# Patient Record
Sex: Male | Born: 1955 | Race: White | Hispanic: No | Marital: Married | Smoking: Never smoker
Health system: Southern US, Community
[De-identification: ages and names within clinical notes are randomized; demographics above are authoritative.]

## PROBLEM LIST (undated history)

## (undated) DIAGNOSIS — C4491 Basal cell carcinoma of skin, unspecified: Secondary | ICD-10-CM

## (undated) HISTORY — PX: FRACTURE SURGERY: SHX138

## (undated) HISTORY — PX: HERNIA REPAIR: SHX51

## (undated) HISTORY — DX: Basal cell carcinoma of skin, unspecified: C44.91

---

## 2010-12-04 ENCOUNTER — Encounter (HOSPITAL_COMMUNITY): Payer: BC Managed Care – PPO | Attending: General Surgery

## 2010-12-04 DIAGNOSIS — K4020 Bilateral inguinal hernia, without obstruction or gangrene, not specified as recurrent: Secondary | ICD-10-CM | POA: Insufficient documentation

## 2010-12-04 DIAGNOSIS — Z01818 Encounter for other preprocedural examination: Secondary | ICD-10-CM | POA: Insufficient documentation

## 2010-12-04 LAB — COMPREHENSIVE METABOLIC PANEL
ALT: 22 U/L (ref 0–53)
AST: 28 U/L (ref 0–37)
Albumin: 3.8 g/dL (ref 3.5–5.2)
CO2: 28 mEq/L (ref 19–32)
Chloride: 104 mEq/L (ref 96–112)
GFR calc Af Amer: >60 mL/min (ref >60)
GFR calc non Af Amer: >60 mL/min (ref >60)
Sodium: 139 mEq/L (ref 135–145)
Total Bilirubin: 0.8 mg/dL (ref 0.3–1.2)

## 2010-12-04 LAB — DIFFERENTIAL
Basophils Absolute: 0 10*3/uL (ref 0.0–0.1)
Basophils Relative: 0 % (ref 0–1)
Neutro Abs: 2.6 10*3/uL (ref 1.7–7.7)
Neutrophils Relative %: 54 % (ref 43–77)

## 2010-12-04 LAB — CBC
Hemoglobin: 14 g/dL (ref 13.0–17.0)
Platelets: 205 10*3/uL (ref 150–400)
RBC: 4.45 MIL/uL (ref 4.22–5.81)
WBC: 4.9 10*3/uL (ref 4.0–10.5)

## 2010-12-04 LAB — SURGICAL PCR SCREEN: MRSA, PCR: NEGATIVE

## 2010-12-04 LAB — URINALYSIS, ROUTINE W REFLEX MICROSCOPIC
Bilirubin Urine: NEGATIVE
Hgb urine dipstick: NEGATIVE
Ketones, ur: NEGATIVE mg/dL
Nitrite: NEGATIVE
pH: 7 (ref 5.0–8.0)

## 2010-12-16 ENCOUNTER — Ambulatory Visit (HOSPITAL_COMMUNITY)
Admission: RE | Admit: 2010-12-16 | Discharge: 2010-12-16 | Disposition: A | Discharge status: Home / Self Care | Payer: BC Managed Care – PPO | Attending: General Surgery | Admitting: General Surgery

## 2010-12-16 DIAGNOSIS — K4020 Bilateral inguinal hernia, without obstruction or gangrene, not specified as recurrent: Secondary | ICD-10-CM | POA: Insufficient documentation

## 2010-12-20 NOTE — Op Note (Signed)
Jorge Finley, Jorge Finley                 ACCOUNT NO.:  [id number]  MEDICAL RECORD NO.:  [id number]           PATIENT TYPE:  O  LOCATION:  DAYL                         FACILITY:  [facility]  PHYSICIAN:  Angelia Mould. Derrell Lolling, M.D.DATE OF BIRTH:  [birth year]  DATE OF PROCEDURE:  12/16/2010 DATE OF DISCHARGE:                              OPERATIVE REPORT   PREOPERATIVE DIAGNOSIS:  Bilateral inguinal hernias.  POSTOPERATIVE DIAGNOSIS:  Bilateral inguinal hernias.  OPERATION PERFORMED:  Laparoscopic, preperitoneal repair of bilateral inguinal hernias with mesh.  SURGEON:  Angelia Mould. Derrell Lolling, M.D.  OPERATIVE INDICATIONS:  This is a thin, healthy 54 year old gentleman who enjoys good health.  He is very active physically including running triathlons.  For the past 3 months, he has got some pain in the right groin but he has continued to exercise.  Pain has become more persistent.  He saw Dr. Mila Homer at [facility] who was suspicious of a hernia as well as constipation.  I evaluated the patient in my office about 3 to 4 weeks ago and found that he actually had bilateral inguinal hernias and the left side was actually bit larger on exam, but these did not extend into the scrotum.  There was no inguinal adenopathy or mass.  He really was not tender.  He moved all 4 extremities well without pain or deformity.  He would like to go ahead and have these repaired at this time and is brought to operating room electively.  OPERATIVE TECHNIQUE:  Following the induction of general endotracheal anesthesia, a Foley catheter was inserted and the bladder was emptied. The Foley was left in place with the balloon deflated.  The abdomen and genitalia were prepped and draped in sterile fashion.  Intravenous antibiotics were given.  Surgical time-out was held identifying correct patient, correct procedure and correct site.  A 0.5% Marcaine with epinephrine was used as local infiltration anesthetic.  A  curved transverse incision was made at the lower rim of the umbilicus.  The fascia was incised transversely exposing the right rectus muscle.  A dissector balloon was placed in the right rectus sheath behind the rectus muscle down to the symphysis pubis.  A video camera was inserted. The dissector balloon was inflated under direct vision and held in place for about 5 minutes.  We had good visualization of symphysis pubis, Cooper's ligaments, inferior epigastric vessels and the rectus muscles anteriorly.  We then deflated the balloon.  We inflated the trocar, balloon and secured that and connected it to the insufflator at 14 mmHg. Video camera was inserted and we had good visualization.  The operating space was small because of the patient's small body habitus.  We put a 5- mm trocar in the midline below the umbilicus and used that to clean off the peritoneum from the fascia of the lateral abdominal wall on both the right side and the left side.  We ultimately put a 5-mm trocar in the right side and left side above the level of the anterior superior iliac spine.  On each side, we cleaned off the peritoneum and pulled it down away from  the lateral abdominal wall.  On each side, we found an indirect hernia sac which we pulled back as far as possible but certainly well above the level of the anterior superior iliac spine.  On the left side, he actually also had direct hernia and we debrided the fatty tissue off that allowing the pseudo-sac to retract.  After all this was done, we found that we had a pneumoperitoneum from a small hole on the right side and we simply put a Veress needle in the right upper quadrant to evacuate that.  We repaired both sides with 3 inch x 6 inch pieces of Ultrapro mesh. The pieces of mesh were overlapped slightly in the midline.  A 5-mm ProTack was used to secure the mesh along the superior rim of Cooper's ligament on each side, along the posterior belly of the  rectus muscle and up the midline.  Laterally on each side we placed 3 or 4 tacks but made sure that we could palpate the tacker through the abdominal wall to make sure that we were above the iliopubic tract.  On inspection of the right side, I felt that the mesh had pulled up a little bit above the superior edge of Cooper's ligament near the iliac vessels and so I took a 3 inch x 3 inch piece of Ultrapro mesh and placed it in the abdomen and also placed it over this area to make sure that we did not get direct hernia recurrence.  I placed a couple of ProTack superiorly well above the level the iliopubic tract to hold this in place.  We then made sure that we pulled all the peritoneum back and tucked the mesh down inferiorly.  The pneumoperitoneum was released under direct vision. Everything looked good.  There was no bleeding.  The trocars were all removed.  The pneumoperitoneum was released all the way and the Veress needle taken out of the right upper quadrant.  The fascia at the umbilicus was closed with 2 interrupted figure-of-eight sutures of 0 Vicryl.  After irrigating the wounds, they were all closed with subcuticular suture of 4-0 Monocryl and Dermabond.  The patient was taken to the recovery room in stable condition.  Estimated blood loss was about 10 to 20 cc.  Complications were none.  Sponge, needle and instrument counts were correct.     Angelia Mould. Derrell Lolling, M.D.     HMI/MEDQ  D:  12/16/2010  T:  12/16/2010  Job:  [id number]  cc:   Dr. Mila Homer  Electronically Signed by Claud Kelp M.D. on 12/20/2010 07:10:04 PM

## 2013-06-28 ENCOUNTER — Encounter: Payer: Self-pay | Admitting: Radiology

## 2016-06-17 ENCOUNTER — Other Ambulatory Visit: Payer: Self-pay | Admitting: Radiation Oncology

## 2018-02-08 DIAGNOSIS — H18413 Arcus senilis, bilateral: Secondary | ICD-10-CM | POA: Diagnosis not present

## 2018-02-08 DIAGNOSIS — H11423 Conjunctival edema, bilateral: Secondary | ICD-10-CM | POA: Diagnosis not present

## 2018-02-08 DIAGNOSIS — H11153 Pinguecula, bilateral: Secondary | ICD-10-CM | POA: Diagnosis not present

## 2018-02-08 DIAGNOSIS — H40013 Open angle with borderline findings, low risk, bilateral: Secondary | ICD-10-CM | POA: Diagnosis not present

## 2018-05-01 DIAGNOSIS — L570 Actinic keratosis: Secondary | ICD-10-CM | POA: Diagnosis not present

## 2018-05-01 DIAGNOSIS — Z85828 Personal history of other malignant neoplasm of skin: Secondary | ICD-10-CM | POA: Diagnosis not present

## 2018-05-01 DIAGNOSIS — L821 Other seborrheic keratosis: Secondary | ICD-10-CM | POA: Diagnosis not present

## 2018-05-01 DIAGNOSIS — C44519 Basal cell carcinoma of skin of other part of trunk: Secondary | ICD-10-CM | POA: Diagnosis not present

## 2018-08-21 DIAGNOSIS — L218 Other seborrheic dermatitis: Secondary | ICD-10-CM | POA: Diagnosis not present

## 2018-08-21 DIAGNOSIS — Z85828 Personal history of other malignant neoplasm of skin: Secondary | ICD-10-CM | POA: Diagnosis not present

## 2018-08-21 DIAGNOSIS — L649 Androgenic alopecia, unspecified: Secondary | ICD-10-CM | POA: Diagnosis not present

## 2019-03-28 DIAGNOSIS — H0102A Squamous blepharitis right eye, upper and lower eyelids: Secondary | ICD-10-CM | POA: Diagnosis not present

## 2019-03-28 DIAGNOSIS — H25013 Cortical age-related cataract, bilateral: Secondary | ICD-10-CM | POA: Diagnosis not present

## 2019-03-28 DIAGNOSIS — H16223 Keratoconjunctivitis sicca, not specified as Sjogren's, bilateral: Secondary | ICD-10-CM | POA: Diagnosis not present

## 2019-03-28 DIAGNOSIS — H40013 Open angle with borderline findings, low risk, bilateral: Secondary | ICD-10-CM | POA: Diagnosis not present

## 2019-04-10 DIAGNOSIS — L259 Unspecified contact dermatitis, unspecified cause: Secondary | ICD-10-CM | POA: Diagnosis not present

## 2019-04-29 ENCOUNTER — Other Ambulatory Visit: Payer: Self-pay

## 2019-04-29 ENCOUNTER — Encounter (HOSPITAL_COMMUNITY): Payer: Self-pay

## 2019-04-29 ENCOUNTER — Emergency Department (HOSPITAL_COMMUNITY): Payer: BC Managed Care – PPO

## 2019-04-29 ENCOUNTER — Emergency Department (HOSPITAL_COMMUNITY)
Admission: EM | Admit: 2019-04-29 | Discharge: 2019-04-29 | Disposition: A | Discharge status: Home / Self Care | Payer: BC Managed Care – PPO | Attending: Emergency Medicine | Admitting: Emergency Medicine

## 2019-04-29 DIAGNOSIS — Y9355 Activity, bike riding: Secondary | ICD-10-CM | POA: Diagnosis not present

## 2019-04-29 DIAGNOSIS — Z23 Encounter for immunization: Secondary | ICD-10-CM | POA: Diagnosis not present

## 2019-04-29 DIAGNOSIS — Y999 Unspecified external cause status: Secondary | ICD-10-CM | POA: Insufficient documentation

## 2019-04-29 DIAGNOSIS — S50312A Abrasion of left elbow, initial encounter: Secondary | ICD-10-CM | POA: Insufficient documentation

## 2019-04-29 DIAGNOSIS — T07XXXA Unspecified multiple injuries, initial encounter: Secondary | ICD-10-CM

## 2019-04-29 DIAGNOSIS — Z85828 Personal history of other malignant neoplasm of skin: Secondary | ICD-10-CM | POA: Insufficient documentation

## 2019-04-29 DIAGNOSIS — S40212A Abrasion of left shoulder, initial encounter: Secondary | ICD-10-CM | POA: Insufficient documentation

## 2019-04-29 DIAGNOSIS — W19XXXA Unspecified fall, initial encounter: Secondary | ICD-10-CM | POA: Diagnosis not present

## 2019-04-29 DIAGNOSIS — S060X1A Concussion with loss of consciousness of 30 minutes or less, initial encounter: Secondary | ICD-10-CM | POA: Insufficient documentation

## 2019-04-29 DIAGNOSIS — S0081XA Abrasion of other part of head, initial encounter: Secondary | ICD-10-CM | POA: Insufficient documentation

## 2019-04-29 DIAGNOSIS — S40812A Abrasion of left upper arm, initial encounter: Secondary | ICD-10-CM | POA: Diagnosis not present

## 2019-04-29 DIAGNOSIS — R52 Pain, unspecified: Secondary | ICD-10-CM | POA: Diagnosis not present

## 2019-04-29 DIAGNOSIS — S80212A Abrasion, left knee, initial encounter: Secondary | ICD-10-CM | POA: Diagnosis not present

## 2019-04-29 DIAGNOSIS — S0990XA Unspecified injury of head, initial encounter: Secondary | ICD-10-CM | POA: Diagnosis not present

## 2019-04-29 DIAGNOSIS — Y929 Unspecified place or not applicable: Secondary | ICD-10-CM | POA: Insufficient documentation

## 2019-04-29 DIAGNOSIS — S199XXA Unspecified injury of neck, initial encounter: Secondary | ICD-10-CM | POA: Diagnosis not present

## 2019-04-29 MED ORDER — BACITRACIN ZINC 500 UNIT/GM EX OINT
1.0000 "application " | TOPICAL_OINTMENT | Freq: Two times a day (BID) | CUTANEOUS | Status: DC
  Administered 2019-04-29: 1 via TOPICAL

## 2019-04-29 MED ORDER — TETANUS-DIPHTH-ACELL PERTUSSIS 5-2.5-18.5 LF-MCG/0.5 IM SUSP
0.5000 mL | Freq: Once | INTRAMUSCULAR | Status: AC
  Administered 2019-04-29: 0.5 mL via INTRAMUSCULAR
  Filled 2019-04-29: qty 0.5
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
## 2019-04-29 NOTE — ED Triage Notes (Signed)
Patient's wife, Romie Minus, would like an update when possible: [phone].
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
## 2019-05-16 DIAGNOSIS — M24812 Other specific joint derangements of left shoulder, not elsewhere classified: Secondary | ICD-10-CM | POA: Diagnosis not present

## 2019-07-25 DIAGNOSIS — M24812 Other specific joint derangements of left shoulder, not elsewhere classified: Secondary | ICD-10-CM | POA: Diagnosis not present

## 2019-07-29 DIAGNOSIS — Z20828 Contact with and (suspected) exposure to other viral communicable diseases: Secondary | ICD-10-CM | POA: Diagnosis not present

## 2019-09-12 DIAGNOSIS — Z20828 Contact with and (suspected) exposure to other viral communicable diseases: Secondary | ICD-10-CM | POA: Diagnosis not present

## 2019-09-17 DIAGNOSIS — J209 Acute bronchitis, unspecified: Secondary | ICD-10-CM | POA: Diagnosis not present

## 2019-09-17 DIAGNOSIS — J4520 Mild intermittent asthma, uncomplicated: Secondary | ICD-10-CM | POA: Diagnosis not present

## 2019-09-20 DIAGNOSIS — Z01818 Encounter for other preprocedural examination: Secondary | ICD-10-CM | POA: Diagnosis not present

## 2019-10-10 ENCOUNTER — Other Ambulatory Visit: Payer: Self-pay

## 2019-10-10 ENCOUNTER — Ambulatory Visit (INDEPENDENT_AMBULATORY_CARE_PROVIDER_SITE_OTHER): Payer: BC Managed Care – PPO | Admitting: Family Medicine

## 2019-10-10 VITALS — BP 146/90 | Ht 70.0 in | Wt 143.0 lb

## 2019-10-10 DIAGNOSIS — M76892 Other specified enthesopathies of left lower limb, excluding foot: Secondary | ICD-10-CM

## 2019-10-10 MED ORDER — NITROGLYCERIN 0.2 MG/HR TD PT24: MEDICATED_PATCH | TRANSDERMAL | 1 refills | Status: DC

## 2019-10-10 NOTE — Patient Instructions (Signed)
You have strained your proximal hamstring tendon with resultant tendinopathy. Aleve 2 tabs twice a day with food OR ibuprofen 600mg  three times a day with food only if needed. Ice or Heat 15 minutes at a time 3-4 times a day. Do hamstring strengthening exercises every day 3 sets of 10. Nitro patches - 1/4th patch to affected area, change daily as we discussed. Consider physical therapy as well if not improving as expected. Cross train with elliptical, cycling, swimming. After a few weeks you can try on flat surface to do walk: jog program (1:1 for 10 minutes;  1:2 for 15 minutes, 1:3 for 20 minutes etc) but not on back to back days. Follow up with me in 6 weeks.

## 2019-10-11 ENCOUNTER — Encounter: Payer: Self-pay | Admitting: Family Medicine

## 2019-10-11 NOTE — Progress Notes (Signed)
PCP: Patient, No Pcp Per  Subjective:   HPI: Patient is a 63 y.o. male here for left hip pain.  Patient reports he's had off and on problems with left hamstring, posterior hip over the years. Current issue started about 2 months ago when he strained hamstring. Then about 1 month ago after a run had fairly severe sharp pain in proximal posterior left thigh/posterior hip area. He's rested from running and focused on elliptical. Has been stretching, using foam roller, and taking advil with mild improvement. Pain is minimal now at rest. Bothers more with sitting. No skin changes, swelling, bruising was noted.  Past Medical History:  Diagnosis Date  . Basal cell carcinoma [id number]   left posterior leg    Current Outpatient Medications on File Prior to Visit  Medication Sig Dispense Refill  . albuterol (VENTOLIN HFA) 108 (90 Base) MCG/ACT inhaler SMARTSIG:1.5 Inhalation Via Inhaler Every 4-6 Hours PRN     No current facility-administered medications on file prior to visit.    History reviewed. No pertinent surgical history.  No Known Allergies  Social History   Socioeconomic History  . Marital status: Married    Spouse name: Not on file  . Number of children: Not on file  . Years of education: Not on file  . Highest education level: Not on file  Occupational History  . Not on file  Tobacco Use  . Smoking status: Not on file  Substance and Sexual Activity  . Alcohol use: Yes    Alcohol/week: 14.0 standard drinks    Types: 14 Glasses of wine per week    Comment: 2 a night  . Drug use: Not on file  . Sexual activity: Not on file  Other Topics Concern  . Not on file  Social History Narrative  . Not on file   Social Determinants of Health   Financial Resource Strain:   . Difficulty of Paying Living Expenses: Not on file  Food Insecurity:   . Worried About Charity fundraiser in the Last Year: Not on file  . Ran Out of Food in the Last Year: Not on file   Transportation Needs:   . Lack of Transportation (Medical): Not on file  . Lack of Transportation (Non-Medical): Not on file  Physical Activity:   . Days of Exercise per Week: Not on file  . Minutes of Exercise per Session: Not on file  Stress:   . Feeling of Stress : Not on file  Social Connections:   . Frequency of Communication with Friends and Family: Not on file  . Frequency of Social Gatherings with Friends and Family: Not on file  . Attends Religious Services: Not on file  . Active Member of Clubs or Organizations: Not on file  . Attends Archivist Meetings: Not on file  . Marital Status: Not on file  Intimate Partner Violence:   . Fear of Current or Ex-Partner: Not on file  . Emotionally Abused: Not on file  . Physically Abused: Not on file  . Sexually Abused: Not on file    History reviewed. No pertinent family history.  BP (!) 146/90   Ht 5\' 10"  (1.778 m)   Wt 143 lb (64.9 kg)   BMI 20.52 kg/m   Review of Systems: See HPI above.     Objective:  Physical Exam:  Gen: NAD, comfortable in exam room  Left hip: No deformity, instability. FROM with 5/5 strength except 5-/5 with resisted knee flexion at 30 degrees.  Minimal tenderness to palpation proximal hamstring tendon at ischial tuberosity.  No piriformis, trochanter, other tenderness NVI distally. Negative logroll. Negative piriformis, faber, fadir.  Right hip: No deformity, instability. FROM with 5/5 strength. No tenderness to palpation. NVI distally.   Assessment & Plan:  1. Left hip pain - consistent with strain of proximal hamstring tendon with resultant tendinopathy.  Exam otherwise reassuring.  Home exercises, nitro patches (discussed risks of headache, skin irritation).  Cross training.  Discussed return to running and how to do so.  F/u in 6 weeks.  Ice or heat, aleve or ibuprofen only if needed.

## 2019-11-08 DIAGNOSIS — Z20828 Contact with and (suspected) exposure to other viral communicable diseases: Secondary | ICD-10-CM | POA: Diagnosis not present
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
## 2019-11-21 ENCOUNTER — Other Ambulatory Visit: Payer: Self-pay

## 2019-11-21 ENCOUNTER — Encounter: Payer: Self-pay | Admitting: Family Medicine

## 2019-11-21 ENCOUNTER — Ambulatory Visit (INDEPENDENT_AMBULATORY_CARE_PROVIDER_SITE_OTHER): Payer: BC Managed Care – PPO | Admitting: Family Medicine

## 2019-11-21 VITALS — BP 140/80 | Ht 70.0 in | Wt 145.0 lb

## 2019-11-21 DIAGNOSIS — M76892 Other specified enthesopathies of left lower limb, excluding foot: Secondary | ICD-10-CM

## 2019-11-21 NOTE — Patient Instructions (Signed)
Increased the nitro patches to 1/2 patch and change daily. Start physical therapy with possible dry needling. Do home exercises on days you don't go to therapy. We will consider steroid injection if not improving as expected. Follow up with me in 6 weeks.

## 2019-11-21 NOTE — Progress Notes (Signed)
PCP: Patient, No Pcp Per  Subjective:   HPI: Patient is a 64 y.o. male here for left hip pain.  12/9: Patient reports he's had off and on problems with left hamstring, posterior hip over the years. Current issue started about 2 months ago when he strained hamstring. Then about 1 month ago after a run had fairly severe sharp pain in proximal posterior left thigh/posterior hip area. He's rested from running and focused on elliptical. Has been stretching, using foam roller, and taking advil with mild improvement. Pain is minimal now at rest. Bothers more with sitting. No skin changes, swelling, bruising was noted.  11/21/19: Patient reports he feels about the same. Has been doing home exercises and using 1/4 nitro patches, changing daily. No pain with walking, swimming, using elliptical. Has not tried running yet. Bothers quite a bit with sitting on this area. No radiation down leg. No numbness, tingling. No back pain.  Past Medical History:  Diagnosis Date  . Basal cell carcinoma [id number]   left posterior leg    Current Outpatient Medications on File Prior to Visit  Medication Sig Dispense Refill  . nitroGLYCERIN (NITRODUR - DOSED IN MG/24 HR) 0.2 mg/hr patch Apply 1/4th patch to affected area, change daily 30 patch 1  . albuterol (VENTOLIN HFA) 108 (90 Base) MCG/ACT inhaler SMARTSIG:1.5 Inhalation Via Inhaler Every 4-6 Hours PRN     No current facility-administered medications on file prior to visit.    History reviewed. No pertinent surgical history.  No Known Allergies  Social History   Socioeconomic History  . Marital status: Married    Spouse name: Not on file  . Number of children: Not on file  . Years of education: Not on file  . Highest education level: Not on file  Occupational History  . Not on file  Tobacco Use  . Smoking status: Not on file  Substance and Sexual Activity  . Alcohol use: Yes    Alcohol/week: 14.0 standard drinks    Types: 14 Glasses  of wine per week    Comment: 2 a night  . Drug use: Not on file  . Sexual activity: Not on file  Other Topics Concern  . Not on file  Social History Narrative  . Not on file   Social Determinants of Health   Financial Resource Strain:   . Difficulty of Paying Living Expenses: Not on file  Food Insecurity:   . Worried About Charity fundraiser in the Last Year: Not on file  . Ran Out of Food in the Last Year: Not on file  Transportation Needs:   . Lack of Transportation (Medical): Not on file  . Lack of Transportation (Non-Medical): Not on file  Physical Activity:   . Days of Exercise per Week: Not on file  . Minutes of Exercise per Session: Not on file  Stress:   . Feeling of Stress : Not on file  Social Connections:   . Frequency of Communication with Friends and Family: Not on file  . Frequency of Social Gatherings with Friends and Family: Not on file  . Attends Religious Services: Not on file  . Active Member of Clubs or Organizations: Not on file  . Attends Archivist Meetings: Not on file  . Marital Status: Not on file  Intimate Partner Violence:   . Fear of Current or Ex-Partner: Not on file  . Emotionally Abused: Not on file  . Physically Abused: Not on file  . Sexually Abused: Not  on file    History reviewed. No pertinent family history.  BP 140/80   Ht 5\' 10"  (1.778 m)   Wt 145 lb (65.8 kg)   BMI 20.81 kg/m   Review of Systems: See HPI above.     Objective:  Physical Exam:  Gen: NAD, comfortable in exam room  Left hip: No deformity. FROM with 5/5 strength except 5-/5 with resisted knee flexion at 30 degrees. No tenderness to palpation but 'feels it' at proximal hamstring at ischial tuberosity.  No piriformis, trochanter tenderness NVI distally. Negative logroll, faber, fadir.   Assessment & Plan:  1. Left hip pain - 2/2 proximal hamstring tendinopathy.  Increase nitro to 1/2 patch. Start physical therapy with probable dry needling.  F/u  in 6 weeks.  Consider steroid injection if not improving.  Ice or heat, ibuprofen or aleve if needed.

## 2019-12-03 ENCOUNTER — Other Ambulatory Visit: Payer: Self-pay

## 2019-12-03 ENCOUNTER — Ambulatory Visit: Payer: BC Managed Care – PPO | Attending: Family Medicine

## 2019-12-03 DIAGNOSIS — M76892 Other specified enthesopathies of left lower limb, excluding foot: Secondary | ICD-10-CM

## 2019-12-03 DIAGNOSIS — M6281 Muscle weakness (generalized): Secondary | ICD-10-CM | POA: Insufficient documentation

## 2019-12-03 NOTE — Patient Instructions (Signed)
Prone hip ext with knee flexed and straight  X 10 reps 1-2 sets 1-2x/day hold 2-5 sec RT and LT

## 2019-12-03 NOTE — Therapy (Signed)
[place], [place], [address] Phone: [phone]   Fax:  [phone]  Physical Therapy Evaluation  Patient Details  Name: Jorge Finley MRN: [id number] Date of Birth: [birth year] Referring Provider (PT): Karlton Lemon, MD   Encounter Date: 12/03/2019  PT End of Session - 12/03/19 1716    Visit Number  1    Number of Visits  12    Date for PT Re-Evaluation  01/11/20    Authorization Type  BCBS    PT Start Time  0415    PT Stop Time  0508    PT Time Calculation (min)  53 min    Activity Tolerance  Patient tolerated treatment well    Behavior During Therapy  [facility] for tasks assessed/performed       Past Medical History:  Diagnosis Date  . Basal cell carcinoma [id number]   left posterior leg    History reviewed. No pertinent surgical history.  There were no vitals filed for this visit.   Subjective Assessment - 12/03/19 1621    Subjective  He  Reports Lt buttock pain at hamstring insertion.  He gets pain with sitting. and with running.   Has started running with min pain 1 mile max  x 3 reps.    Limitations  Sitting    How long can you sit comfortably?  10 min    Diagnostic tests  none    Patient Stated Goals  To run and sit without pain    Currently in Pain?  Yes    Pain Score  --   highest pain in past week 4/10 for brief period   Pain Location  Buttocks    Pain Orientation  Left    Pain Descriptors / Indicators  Aching    Pain Type  Chronic pain    Pain Onset  More than a month ago    Pain Frequency  Intermittent    Aggravating Factors   sitting /running    Pain Relieving Factors  Change postions.    Multiple Pain Sites  No         [facility] PT Assessment - 12/03/19 0001      Assessment   Medical Diagnosis  Lt hamstring pain.     Referring Provider (PT)  Karlton Lemon, MD    Onset Date/Surgical Date  --   09/2019   Next MD Visit  6 weeks    Prior Therapy  No         Precautions    Precautions  None      Restrictions   Weight Bearing Restrictions  No      Balance Screen   Has the patient fallen in the past 6 months  No      Prior Function   Level of Independence  Independent      Cognition   Overall Cognitive Status  Within Functional Limits for tasks assessed      Posture/Postural Control   Posture Comments  RT shoulder lower, ilia higher on Lt        ROM / Strength   AROM / PROM / Strength  AROM;PROM;Strength      AROM   Overall AROM Comments  decr LT hip ext due to weakness      PROM   Overall PROM Comments  Tight LT ITB, and mild quad tightness      Strength   Overall Strength Comments  weakness LT gluteals. 3-/5  due to  Flexibility   Soft Tissue Assessment /Muscle Length  yes    Quadriceps  mild tightness in prone    ITB  LT tight      Palpation   SI assessment   asymetry in pelvis with LT leg short in supine    Palpation comment  tender iscial tuberosity LT mild                Objective measurements completed on examination: See above findings.              PT Education - 12/03/19 1734    Education Details  POC,  HEP, findings of assessment    Person(s) Educated  Patient    Methods  Explanation;Tactile cues;Verbal cues;Handout    Comprehension  Returned demonstration;Verbalized understanding       PT Short Term Goals - 12/03/19 1717      PT SHORT TERM GOAL #1   Title  He will be indpendent with initial HEp    Time  3    Period  Weeks    Status  New      PT SHORT TERM GOAL #2   Title  He will improve Lt hip ext strength to be able to go full ROM    Time  3    Period  Weeks    Status  New      PT SHORT TERM GOAL #3   Title  He will report able to sit with 50% less pain    Time  3    Period  Weeks    Status  New        PT Long Term Goals - 12/03/19 1718      PT LONG TERM GOAL #1   Title  He will be indpendent with all hEP issued    Time  6    Period  Weeks    Status  New      PT LONG  TERM GOAL #2   Title  He wiull rturn to running 3 miles with no pain    Time  6    Period  Weeks    Status  New      PT LONG TERM GOAL #3   Title  He will be able to sit without pain 60 min or more    Time  6    Period  Weeks    Status  New      PT LONG TERM GOAL #4   Title  FOTO improved from 18% limited to 11% limited  or better    Time  6    Period  Weeks    Status  New             Plan - 12/03/19 1721    Clinical Impression Statement  Mr Feicht presents with RT hamstring iscial tuberosity pain with sitting and running. He is weak in Lt gluteals possibly contributing to incr hamstring work. He also has asymetry in pelvis and in supine shorter LT leg which may impact his running. He has begun to run shorter distances successfully  up to now.  He should benefit from skilled PT.    Personal Factors and Comorbidities  Time since onset of injury/illness/exacerbation    Examination-Activity Limitations  --   running, sitting   Stability/Clinical Decision Making  Stable/Uncomplicated    Clinical Decision Making  Low    Rehab Potential  Good    PT Frequency  2x / week  PT Duration  6 weeks    PT Treatment/Interventions  Iontophoresis 4mg /ml Dexamethasone;Ultrasound;Therapeutic exercise;Patient/family education;Manual techniques;Dry needling    PT Next Visit Plan  REveiw hip ext strength exer and progress as needed,  manual and modalities as needed.    PT Home Exercise Plan  prone with pillow under hips hip ext with knee bent and straight.    Consulted and Agree with Plan of Care  Patient       Patient will benefit from skilled therapeutic intervention in order to improve the following deficits and impairments:  Pain, Decreased strength, Postural dysfunction, Decreased activity tolerance  Visit Diagnosis: Hamstring tendonitis of left thigh  Muscle weakness (generalized)     Problem List There are no problems to display for this patient.   Darrel Hoover   PT 12/03/2019, 5:35 PM  [facility] [address] [place], [place], [address] Phone: [phone]   Fax:  [phone]  Name: Jorge Finley MRN: [id number] Date of Birth: [birth year]

## 2019-12-10 ENCOUNTER — Encounter: Payer: Self-pay | Admitting: Physical Therapy

## 2019-12-10 ENCOUNTER — Ambulatory Visit: Payer: BC Managed Care – PPO | Admitting: Physical Therapy

## 2019-12-10 ENCOUNTER — Other Ambulatory Visit: Payer: Self-pay

## 2019-12-10 DIAGNOSIS — M6281 Muscle weakness (generalized): Secondary | ICD-10-CM | POA: Diagnosis not present

## 2019-12-10 DIAGNOSIS — M76892 Other specified enthesopathies of left lower limb, excluding foot: Secondary | ICD-10-CM | POA: Diagnosis not present

## 2019-12-10 NOTE — Therapy (Signed)
[place], [place], [address] Phone: [phone]   Fax:  [phone]  Physical Therapy Treatment  Patient Details  Name: Jorge Finley MRN: [id number] Date of Birth: 1956-02-16 Referring Provider (PT): Karlton Lemon, MD   Encounter Date: 12/10/2019  PT End of Session - 12/10/19 2103    Visit Number  2    Number of Visits  12    Date for PT Re-Evaluation  01/11/20    Authorization Type  BCBS    PT Start Time  1721    PT Stop Time  1806    PT Time Calculation (min)  45 min    Activity Tolerance  Patient tolerated treatment well    Behavior During Therapy  [facility] for tasks assessed/performed       Past Medical History:  Diagnosis Date  . Basal cell carcinoma [id number]   left posterior leg    History reviewed. No pertinent surgical history.  There were no vitals filed for this visit.  Subjective Assessment - 12/10/19 2058    Subjective  Continues with left proximal hamstring/ischial tuberosity pain with sitting.                       [facility] Adult PT Treatment/Exercise - 12/10/19 0001      Exercises   Exercises  Knee/Hip      Knee/Hip Exercises: Stretches   Passive Hamstring Stretch  Left;3 reps;30 seconds      Knee/Hip Exercises: Aerobic   Elliptical  L1 incline 2 x 5 min      Knee/Hip Exercises: Supine   Bridges  AROM;Strengthening;Both;2 sets;10 reps    Bridges Limitations  eccentric emphasis      Knee/Hip Exercises: Prone   Hamstring Curl  2 sets;10 reps    Hamstring Curl Limitations  eccentric with 4 lbs., therapist assist concentric motion    Other Prone Exercises  hamstring partial AROM "fall out" eccentric from tall knee facing wall x 10 reps      Modalities   Modalities  Ultrasound      Ultrasound   Ultrasound Location  left proximal hamstring/ischial tuberosity region    Ultrasound Parameters  1 MHZ 100% 1.1 W/cm2    Ultrasound Goals  Pain   tissue healing     Manual Therapy   Manual Therapy  Soft tissue mobilization    Soft tissue mobilization  IASTM/roller use left hamstring             PT Education - 12/10/19 2102    Education Details  exercises, eccentrics, [place]    Methods  Explanation;Demonstration;Verbal cues;Tactile cues    Comprehension  Verbalized understanding;Returned demonstration       PT Short Term Goals - 12/03/19 1717      PT SHORT TERM GOAL #1   Title  He will be indpendent with initial HEp    Time  3    Period  Weeks    Status  New      PT SHORT TERM GOAL #2   Title  He will improve Lt hip ext strength to be able to go full ROM    Time  3    Period  Weeks    Status  New      PT SHORT TERM GOAL #3   Title  He will report able to sit with 50% less pain    Time  3    Period  Weeks    Status  New        PT Long Term Goals - 12/03/19 1718      PT LONG TERM GOAL #1   Title  He will be indpendent with all hEP issued    Time  6    Period  Weeks    Status  New      PT LONG TERM GOAL #2   Title  He wiull rturn to running 3 miles with no pain    Time  6    Period  Weeks    Status  New      PT LONG TERM GOAL #3   Title  He will be able to sit without pain 60 min or more    Time  6    Period  Weeks    Status  New      PT LONG TERM GOAL #4   Title  FOTO improved from 18% limited to 11% limited  or better    Time  6    Period  Weeks    Status  New            Plan - 12/10/19 2103    Clinical Impression Statement  Pt. returns for first follow up since initial eval. Progressed strengthening with eccentrics and included manual work and modalities with [place]. Continues with limitations for sitting tolerance and prolonged running but given symptom etiology expect progress will be gradual.    Personal Factors and Comorbidities  Time since onset of injury/illness/exacerbation    Stability/Clinical Decision Making  Stable/Uncomplicated    Clinical Decision Making  Low    Rehab Potential  Good    PT  Frequency  2x / week    PT Duration  6 weeks    PT Treatment/Interventions  Iontophoresis 4mg /ml Dexamethasone;Ultrasound;Therapeutic exercise;Patient/family education;Manual techniques;Dry needling    PT Next Visit Plan  assess LLD-check for innominate rotation, consideration heel lift if needed, continue exercise progression, stretches, manual [place] vs. possible trial ionto    PT Home Exercise Plan  prone with pillow under hips hip ext with knee bent and straight, hamstring eccentrics prone vs. tall kneel variations, hip bridge    Consulted and Agree with Plan of Care  Patient       Patient will benefit from skilled therapeutic intervention in order to improve the following deficits and impairments:  Pain, Decreased strength, Postural dysfunction, Decreased activity tolerance  Visit Diagnosis: Hamstring tendonitis of left thigh  Muscle weakness (generalized)     Problem List There are no problems to display for this patient.   Beaulah Dinning, PT, DPT 12/10/19 9:08 PM  [facility] [address] [place], [place], [address] Phone: [phone]   Fax:  [phone]  Name: Jorge Finley MRN: [id number] Date of Birth: [birth year]

## 2019-12-17 ENCOUNTER — Ambulatory Visit: Payer: BC Managed Care – PPO | Admitting: Physical Therapy

## 2019-12-18 DIAGNOSIS — L648 Other androgenic alopecia: Secondary | ICD-10-CM | POA: Diagnosis not present

## 2019-12-18 DIAGNOSIS — Z85828 Personal history of other malignant neoplasm of skin: Secondary | ICD-10-CM | POA: Diagnosis not present

## 2019-12-18 DIAGNOSIS — C44519 Basal cell carcinoma of skin of other part of trunk: Secondary | ICD-10-CM | POA: Diagnosis not present

## 2019-12-18 DIAGNOSIS — L814 Other melanin hyperpigmentation: Secondary | ICD-10-CM | POA: Diagnosis not present

## 2019-12-19 ENCOUNTER — Other Ambulatory Visit: Payer: Self-pay

## 2019-12-19 ENCOUNTER — Ambulatory Visit: Payer: BC Managed Care – PPO | Admitting: Physical Therapy

## 2019-12-19 ENCOUNTER — Encounter: Payer: Self-pay | Admitting: Physical Therapy

## 2019-12-19 DIAGNOSIS — M6281 Muscle weakness (generalized): Secondary | ICD-10-CM

## 2019-12-19 DIAGNOSIS — M76892 Other specified enthesopathies of left lower limb, excluding foot: Secondary | ICD-10-CM

## 2019-12-19 NOTE — Therapy (Signed)
[place], [place], [address] Phone: [phone]   Fax:  [phone]  Physical Therapy Treatment  Patient Details  Name: Jorge Finley MRN: [id number] Date of Birth: [birth year] Referring Provider (PT): Karlton Lemon, MD   Encounter Date: 12/19/2019  PT End of Session - 12/19/19 2021    Visit Number  3    Number of Visits  12    Date for PT Re-Evaluation  01/11/20    Authorization Type  BCBS    PT Start Time  [id number]   late arrival   PT Stop Time  1715    PT Time Calculation (min)  36 min    Activity Tolerance  Patient tolerated treatment well    Behavior During Therapy  [facility] for tasks assessed/performed       Past Medical History:  Diagnosis Date  . Basal cell carcinoma [id number]   left posterior leg    History reviewed. No pertinent surgical history.  There were no vitals filed for this visit.  Subjective Assessment - 12/19/19 2012    Subjective  No pain pre-tx. but continues with left proximal hamstring pain most noted with sitting and still with limitations for running.    Currently in Pain?  No/denies         [facility] PT Assessment - 12/19/19 0001      Strength   Strength Assessment Site  Hip;Knee    Right/Left Hip  Left    Left Hip Extension  4/5    Right/Left Knee  Left    Left Knee Flexion  5/5      Special Tests   Other special tests  Longsitting test negative (checked due to LLD with left LE<right LE)                    [facility] Adult PT Treatment/Exercise - 12/19/19 0001      Knee/Hip Exercises: Stretches   Passive Hamstring Stretch  Left;3 reps;30 seconds      Knee/Hip Exercises: Standing   Forward Step Up  Left;2 sets;10 reps;Step Height: 6"    Forward Step Up Limitations  with opp. hip hike    Step Down Limitations  lateral stepdown with left foot on 6 in. step 2x10    Other Standing Knee Exercises  left single leg [place] deadlift x 10 reps cues for left knee  extension      Knee/Hip Exercises: Supine   Other Supine Knee/Hip Exercises  pt. performing hip bridge, hip ext SLR and also seated hamstring eccentric at gym so held these today      Ultrasound   Ultrasound Location  left proximal hamstring/ischial tuberosity region    Ultrasound Parameters  1 MHZ 100% 1.0 W/cm2    Ultrasound Goals  Pain      Manual Therapy   Soft tissue mobilization  IASTM/roller use left hamstring             PT Education - 12/19/19 2020    Education Details  trial heel lift for left LE due to LLD noted, exercises    Person(s) Educated  Patient    Methods  Explanation;Demonstration;Verbal cues    Comprehension  Returned demonstration;Verbalized understanding       PT Short Term Goals - 12/03/19 1717      PT SHORT TERM GOAL #1   Title  He will be indpendent with initial HEp    Time  3    Period  Weeks  Status  New      PT SHORT TERM GOAL #2   Title  He will improve Lt hip ext strength to be able to go full ROM    Time  3    Period  Weeks    Status  New      PT SHORT TERM GOAL #3   Title  He will report able to sit with 50% less pain    Time  3    Period  Weeks    Status  New        PT Long Term Goals - 12/03/19 1718      PT LONG TERM GOAL #1   Title  He will be indpendent with all hEP issued    Time  6    Period  Weeks    Status  New      PT LONG TERM GOAL #2   Title  He wiull rturn to running 3 miles with no pain    Time  6    Period  Weeks    Status  New      PT LONG TERM GOAL #3   Title  He will be able to sit without pain 60 min or more    Time  6    Period  Weeks    Status  New      PT LONG TERM GOAL #4   Title  FOTO improved from 18% limited to 11% limited  or better    Time  6    Period  Weeks    Status  New            Plan - 12/19/19 2021    Clinical Impression Statement  Check longsitting test due to LLD noted but this was (-) for innominate rotation-trial heel lift which was issued today to use on  left side. For exercise continued focus glut/hip strengthening and stretches. Pt. continues with hamstring eccentrics with HEP, also continued manual and [place]. Fair progress with continued symptoms but given symptom etiology and chronicity expect may take more time for improvement.    Personal Factors and Comorbidities  Time since onset of injury/illness/exacerbation    Stability/Clinical Decision Making  Stable/Uncomplicated    Clinical Decision Making  Low    Rehab Potential  Good    PT Frequency  2x / week    PT Duration  6 weeks    PT Treatment/Interventions  Iontophoresis 4mg /ml Dexamethasone;Ultrasound;Therapeutic exercise;Patient/family education;Manual techniques;Dry needling    PT Next Visit Plan  continue exercise progression, stretches, manual [place] vs. possible trial ionto when available    PT Home Exercise Plan  prone with pillow under hips hip ext with knee bent and straight, hamstring eccentrics prone vs. tall kneel variations, hip bridge    Consulted and Agree with Plan of Care  Patient       Patient will benefit from skilled therapeutic intervention in order to improve the following deficits and impairments:  Pain, Decreased strength, Postural dysfunction, Decreased activity tolerance  Visit Diagnosis: Hamstring tendonitis of left thigh  Muscle weakness (generalized)     Problem List There are no problems to display for this patient.   Beaulah Dinning, PT, DPT 12/19/19 8:27 PM  [facility] [address] [place], [place], [address] Phone: [phone]   Fax:  [phone]  Name: Jorge Finley MRN: [id number] Date of Birth: [birth year]

## 2020-01-02 ENCOUNTER — Ambulatory Visit: Payer: BC Managed Care – PPO | Admitting: Family Medicine

## 2020-01-03 ENCOUNTER — Ambulatory Visit: Payer: BC Managed Care – PPO

## 2020-01-09 ENCOUNTER — Ambulatory Visit: Payer: BC Managed Care – PPO | Attending: Family Medicine | Admitting: Physical Therapy

## 2020-01-09 ENCOUNTER — Encounter: Payer: Self-pay | Admitting: Physical Therapy

## 2020-01-09 ENCOUNTER — Other Ambulatory Visit: Payer: Self-pay

## 2020-01-09 DIAGNOSIS — M6281 Muscle weakness (generalized): Secondary | ICD-10-CM | POA: Insufficient documentation

## 2020-01-09 DIAGNOSIS — M76892 Other specified enthesopathies of left lower limb, excluding foot: Secondary | ICD-10-CM | POA: Diagnosis not present

## 2020-01-09 NOTE — Therapy (Signed)
[place], [place], [address] Phone: [phone]   Fax:  [phone]  Physical Therapy Treatment  Patient Details  Name: Jorge Finley MRN: [id number] Date of Birth: 1956/01/19 Referring Provider (PT): Karlton Lemon, MD   Encounter Date: 01/09/2020  PT End of Session - 01/09/20 1717    Visit Number  4    Number of Visits  12    Date for PT Re-Evaluation  01/11/20    Authorization Type  [employer]    PT Start Time  1630    PT Stop Time  1712    PT Time Calculation (min)  42 min    Activity Tolerance  Patient tolerated treatment well    Behavior During Therapy  [facility] for tasks assessed/performed       Past Medical History:  Diagnosis Date  . Basal cell carcinoma [id number]   left posterior leg    History reviewed. No pertinent surgical history.  There were no vitals filed for this visit.  Subjective Assessment - 01/09/20 1646    Subjective  Pt. returns-not seen for the past 3 weeks with some missed visits due to clinic closure from inclement weather-related issues. Able to tolerate running more with decreased hamstring pain but sitting remains problematic. He tried heel lift but was too big so able to find smaller show insert at home with better fit though hard to tell if helpful.    Currently in Pain?  No/denies         [facility] PT Assessment - 01/09/20 0001      Strength   Left Hip Extension  4/5                   [facility] Adult PT Treatment/Exercise - 01/09/20 0001      Knee/Hip Exercises: Stretches   Passive Hamstring Stretch  Left;3 reps;30 seconds      Knee/Hip Exercises: Standing   Forward Step Up  Left;2 sets;10 reps;Step Height: 8"    Forward Step Up Limitations  with opp. hip hike holding 6 lb. DB ea. UE bilat.    Step Down Limitations  lateral stepdown from 8 in step with right "toe taps" 2x10    SLS  L SLS with foot on Blue Theraband pad 3x10 sec    Other Standing Knee Exercises   deadlift with 10 lb. KB from 8 in. step 2x10    Other Standing Knee Exercises  hip hike with left foot on 4 in. step x 15 reps      Knee/Hip Exercises: Supine   Bridges  AROM;Strengthening;Left;20 reps    Bridges Limitations  single leg bridge on left with legs crossed    Other Supine Knee/Hip Exercises  hip thrusters x 15 reps      Modalities   Modalities  Iontophoresis      Iontophoresis   Type of Iontophoresis  Dexamethasone    Location  left ischial tuberosity    Dose  1 ml   4 mg/mL   Time  --   6 hour take home patch     Manual Therapy   Soft tissue mobilization  IASTM/roller use left hamstring             PT Education - 01/09/20 1715    Education Details  ionto, exercises, symptom etiology    Person(s) Educated  Patient    Methods  Explanation;Demonstration    Comprehension  Verbalized understanding;Returned demonstration       PT  Short Term Goals - 12/03/19 1717      PT SHORT TERM GOAL #1   Title  He will be indpendent with initial HEp    Time  3    Period  Weeks    Status  New      PT SHORT TERM GOAL #2   Title  He will improve Lt hip ext strength to be able to go full ROM    Time  3    Period  Weeks    Status  New      PT SHORT TERM GOAL #3   Title  He will report able to sit with 50% less pain    Time  3    Period  Weeks    Status  New        PT Long Term Goals - 12/03/19 1718      PT LONG TERM GOAL #1   Title  He will be indpendent with all hEP issued    Time  6    Period  Weeks    Status  New      PT LONG TERM GOAL #2   Title  He wiull rturn to running 3 miles with no pain    Time  6    Period  Weeks    Status  New      PT LONG TERM GOAL #3   Title  He will be able to sit without pain 60 min or more    Time  6    Period  Weeks    Status  New      PT LONG TERM GOAL #4   Title  FOTO improved from 18% limited to 11% limited  or better    Time  6    Period  Weeks    Status  New            Plan - 01/09/20 1720     Clinical Impression Statement  Continued strengthening emphasis, glut/hamstring focus along with manual and included trial ionto. Still with functional limitations for sitting tolerance but making functional gains for ability to run. Pt. is scheduled for 1 more therapy session next week and wishes to wait and see how he is doing then before deciding if ready to d/c vs. continue so plan await further status next week.    Personal Factors and Comorbidities  Time since onset of injury/illness/exacerbation    Stability/Clinical Decision Making  Stable/Uncomplicated    Clinical Decision Making  Low    Rehab Potential  Good    PT Frequency  2x / week    PT Duration  6 weeks    PT Treatment/Interventions  Iontophoresis 4mg /ml Dexamethasone;Ultrasound;Therapeutic exercise;Patient/family education;Manual techniques;Dry needling    PT Next Visit Plan  ERO next session, did ionto help, d/c vs. recert to continue    PT Home Exercise Plan  prone with pillow under hips hip ext with knee bent and straight, hamstring eccentrics prone vs. tall kneel variations, hip bridge    Consulted and Agree with Plan of Care  Patient       Patient will benefit from skilled therapeutic intervention in order to improve the following deficits and impairments:  Pain, Decreased strength, Postural dysfunction, Decreased activity tolerance  Visit Diagnosis: Hamstring tendonitis of left thigh  Muscle weakness (generalized)     Problem List There are no problems to display for this patient.   Beaulah Dinning, PT, DPT 01/09/20 5:26 PM  [facility]  [place], [place], [address] Phone: [phone]   Fax:  [phone]  Name: Jorge Finley MRN: [id number] Date of Birth: 23-Dec-1955

## 2020-01-16 ENCOUNTER — Ambulatory Visit: Payer: BC Managed Care – PPO | Admitting: Physical Therapy

## 2020-01-30 ENCOUNTER — Ambulatory Visit (INDEPENDENT_AMBULATORY_CARE_PROVIDER_SITE_OTHER): Payer: BC Managed Care – PPO | Admitting: Family Medicine

## 2020-01-30 ENCOUNTER — Other Ambulatory Visit: Payer: Self-pay

## 2020-01-30 VITALS — BP 142/82 | Ht 70.0 in | Wt 150.0 lb

## 2020-01-30 DIAGNOSIS — M76892 Other specified enthesopathies of left lower limb, excluding foot: Secondary | ICD-10-CM | POA: Diagnosis not present

## 2020-01-30 MED ORDER — METHYLPREDNISOLONE ACETATE 40 MG/ML IJ SUSP
40.0000 mg | Freq: Once | INTRAMUSCULAR | Status: AC
  Administered 2020-01-30: 17:00:00 40 mg via INTRA_ARTICULAR

## 2020-01-30 NOTE — Progress Notes (Signed)
PCP: Patient, No Pcp Per  Subjective:   HPI: Patient is a 64 y.o. male here for follow-up of left hamstring/buttock pain.  3/31: Today the patient reports that the pain has improved with therapy to the point where he has started running 8-10 miles a week without pain, but reports that he has significant pain in the left buttock with prolonged sitting, after about 15 minutes. He stopped using the nitro patches a few weeks ago as he felt they were not helping. He states that physical therapy helped and he had significant relief with dexamethasone iontophoresis that lasted for about 24-36 hours afterwards. He has been doing some squats and leg strengthening exercises. He denies numbness, tingling or pain radiation down the leg.  11/21/19: Patient reports he feels about the same. Has been doing home exercises and using 1/4 nitro patches, changing daily. No pain with walking, swimming, using elliptical. Has not tried running yet. Bothers quite a bit with sitting on this area. No radiation down leg. No numbness, tingling. No back pain.  Past Medical History:  Diagnosis Date  . Basal cell carcinoma [id number]   left posterior leg    Current Outpatient Medications on File Prior to Visit  Medication Sig Dispense Refill  . albuterol (VENTOLIN HFA) 108 (90 Base) MCG/ACT inhaler SMARTSIG:1.5 Inhalation Via Inhaler Every 4-6 Hours PRN     No current facility-administered medications on file prior to visit.    No past surgical history on file.  No Known Allergies  Social History   Socioeconomic History  . Marital status: Married    Spouse name: Not on file  . Number of children: Not on file  . Years of education: Not on file  . Highest education level: Not on file  Occupational History  . Not on file  Tobacco Use  . Smoking status: Not on file  Substance and Sexual Activity  . Alcohol use: Yes    Alcohol/week: 14.0 standard drinks    Types: 14 Glasses of wine per week    Comment:  2 a night  . Drug use: Not on file  . Sexual activity: Not on file  Other Topics Concern  . Not on file  Social History Narrative  . Not on file   Social Determinants of Health   Financial Resource Strain:   . Difficulty of Paying Living Expenses:   Food Insecurity:   . Worried About Charity fundraiser in the Last Year:   . Arboriculturist in the Last Year:   Transportation Needs:   . Film/video editor (Medical):   Marland Kitchen Lack of Transportation (Non-Medical):   Physical Activity:   . Days of Exercise per Week:   . Minutes of Exercise per Session:   Stress:   . Feeling of Stress :   Social Connections:   . Frequency of Communication with Friends and Family:   . Frequency of Social Gatherings with Friends and Family:   . Attends Religious Services:   . Active Member of Clubs or Organizations:   . Attends Archivist Meetings:   Marland Kitchen Marital Status:   Intimate Partner Violence:   . Fear of Current or Ex-Partner:   . Emotionally Abused:   Marland Kitchen Physically Abused:   . Sexually Abused:     No family history on file.  BP (!) 142/82   Ht 5\' 10"  (1.778 m)   Wt 150 lb (68 kg)   BMI 21.52 kg/m   Review of Systems: See HPI  above.     Objective:  Physical Exam:  Gen: NAD, comfortable in exam room  Left hip/leg: No erythema, ecchymosis or swelling. FROM.  TTP over ischial tuberosity. No tenderness over greater trochanter. 5/5 strength hip abduction/adduction/flexion/extension/ER/IR without pain 5/5 strength knee flexion/extension without pain Faber/Fadir tests negative Straight leg test negative.   Assessment & Plan:  1. Left proximal hamstring tendinopathy: This is most likely due to continued pain with sitting and TTP over insertion at ischial tuberosity, although he does not have pain with knee flexion against resistance. -- Steroid injection today in office at level of ischial tuberosity -- Rest leg from intense running or sprints for at least one week to  prevent tendon rupture -- May use NSAIDs prn for pain -- Continue leg strengthening exercises   After consent was obtained, timeout was performed.  Patient was lying prone on the exam table.  Area overlying proximal medial hamstring prepped with alcohol swab then using sterile technique with ultrasound guidance, the left proximal hamstring tendon was injected with 2:1 bupivicaine: depomedrol.  Patient tolerated the procedure well without immediate complications.  The patient is asked to continue to rest the left hamstring for a few more days before resuming regular activities.  It may be more painful for the first 1-2 days.  Watch for fever, or increased swelling or persistent pain of the hamstring. Call or return to clinic prn if such symptoms occur or the hamstring fails to improve as anticipated.

## 2020-01-30 NOTE — Patient Instructions (Signed)
Take it easy from running the next week given you had an injection today. Continue with home exercises though - don't push the weight on these. Follow up with me in 6 weeks or as needed if you're doing well.

## 2020-01-31 ENCOUNTER — Encounter: Payer: Self-pay | Admitting: Family Medicine

## 2020-02-25 NOTE — Therapy (Signed)
[place], [place], [address] Phone: [phone]   Fax:  [phone]  Physical Therapy Treatment/Discharge  Patient Details  Name: Jorge Finley MRN: [id number] Date of Birth: [birth year] Referring Provider (PT): Karlton Lemon, MD   Encounter Date: 01/09/2020    Past Medical History:  Diagnosis Date  . Basal cell carcinoma [id number]   left posterior leg    History reviewed. No pertinent surgical history.  There were no vitals filed for this visit.                              PT Short Term Goals - 12/03/19 1717      PT SHORT TERM GOAL #1   Title  He will be indpendent with initial HEp    Time  3    Period  Weeks    Status  New      PT SHORT TERM GOAL #2   Title  He will improve Lt hip ext strength to be able to go full ROM    Time  3    Period  Weeks    Status  New      PT SHORT TERM GOAL #3   Title  He will report able to sit with 50% less pain    Time  3    Period  Weeks    Status  New        PT Long Term Goals - 12/03/19 1718      PT LONG TERM GOAL #1   Title  He will be indpendent with all hEP issued    Time  6    Period  Weeks    Status  New      PT LONG TERM GOAL #2   Title  He wiull rturn to running 3 miles with no pain    Time  6    Period  Weeks    Status  New      PT LONG TERM GOAL #3   Title  He will be able to sit without pain 60 min or more    Time  6    Period  Weeks    Status  New      PT LONG TERM GOAL #4   Title  FOTO improved from 18% limited to 11% limited  or better    Time  6    Period  Weeks    Status  New              Patient will benefit from skilled therapeutic intervention in order to improve the following deficits and impairments:  Pain, Decreased strength, Postural dysfunction, Decreased activity tolerance  Visit Diagnosis: Hamstring tendonitis of left thigh  Muscle weakness  (generalized)     Problem List There are no problems to display for this patient.       PHYSICAL THERAPY DISCHARGE SUMMARY  Visits from Start of Care: 4  Current functional level related to goals / functional outcomes: Patient did not return for further therapy after last session 01/09/20. Improvement with pain but still with limitations for sitting tolerance. Plan continue via HEP and follow up with MD with any changes in status.   Remaining deficits: Limitations for sitting tolerance   Education / Equipment: NA Plan: Patient agrees to discharge.  Patient goals were partially met. Patient is being discharged due to not returning since the last visit.  ?????  Beaulah Dinning, PT, DPT 02/25/20 2:07 PM     [facility] [address] [place], [place], [address] Phone: [phone]   Fax:  [phone]  Name: Jorge Finley MRN: [id number] Date of Birth: November 07, 1955

## 2020-03-27 DIAGNOSIS — H0102A Squamous blepharitis right eye, upper and lower eyelids: Secondary | ICD-10-CM | POA: Diagnosis not present

## 2020-03-27 DIAGNOSIS — H25013 Cortical age-related cataract, bilateral: Secondary | ICD-10-CM | POA: Diagnosis not present

## 2020-03-27 DIAGNOSIS — H5213 Myopia, bilateral: Secondary | ICD-10-CM | POA: Diagnosis not present

## 2020-03-27 DIAGNOSIS — H16223 Keratoconjunctivitis sicca, not specified as Sjogren's, bilateral: Secondary | ICD-10-CM | POA: Diagnosis not present

## 2020-03-27 DIAGNOSIS — H40013 Open angle with borderline findings, low risk, bilateral: Secondary | ICD-10-CM | POA: Diagnosis not present

## 2020-04-08 ENCOUNTER — Other Ambulatory Visit: Payer: Self-pay

## 2020-04-08 ENCOUNTER — Emergency Department (INDEPENDENT_AMBULATORY_CARE_PROVIDER_SITE_OTHER): Payer: BC Managed Care – PPO

## 2020-04-08 ENCOUNTER — Encounter: Payer: Self-pay | Admitting: Emergency Medicine

## 2020-04-08 ENCOUNTER — Emergency Department (INDEPENDENT_AMBULATORY_CARE_PROVIDER_SITE_OTHER)
Admission: EM | Admit: 2020-04-08 | Discharge: 2020-04-08 | Disposition: A | Discharge status: Home / Self Care | Payer: BC Managed Care – PPO | Source: Home / Self Care | Attending: Family Medicine | Admitting: Family Medicine

## 2020-04-08 DIAGNOSIS — R102 Pelvic and perineal pain: Secondary | ICD-10-CM

## 2020-04-08 DIAGNOSIS — R1024 Suprapubic pain: Secondary | ICD-10-CM

## 2020-04-08 DIAGNOSIS — R1030 Lower abdominal pain, unspecified: Secondary | ICD-10-CM | POA: Diagnosis not present

## 2020-04-08 DIAGNOSIS — R1031 Right lower quadrant pain: Secondary | ICD-10-CM | POA: Diagnosis not present

## 2020-04-08 DIAGNOSIS — S39011A Strain of muscle, fascia and tendon of abdomen, initial encounter: Secondary | ICD-10-CM

## 2020-04-08 DIAGNOSIS — Z9889 Other specified postprocedural states: Secondary | ICD-10-CM

## 2020-04-08 NOTE — Discharge Instructions (Addendum)
Apply ice pack for 20 to 30 minutes, 3 to 4 times daily  Continue until pain and swelling decrease.  May take Ibuprofen 200mg, 4 tabs every 8 hours with food.  Begin range of motion and stretching exercises as tolerated. °

## 2020-04-08 NOTE — ED Provider Notes (Signed)
[facility]    CSN: [id number] Arrival date & time: 04/08/20  1152      History   Chief Complaint Chief Complaint  Patient presents with   Abdominal Pain    RLQ    HPI Deundre A Weible is a 64 y.o. male.   Patient complains of onset of vague intermittent right lower quadrant discomfort about 5 days ago.  The pain is somewhat worse with abdominal flexion and walking.  He feels well otherwise, denying fever/chills, nausea/vomiting, urinary symptoms, and GI symptoms. He reports that he exercises regularly including bicycling and running.  He denies recent changes in his routine.  However, he admits that he did some abdominal crunches at a local gym two days prior to onset of his discomfort. He has a past history of bilateral inguinal hernia repair eight years ago.  The history is provided by the patient.  Abdominal Pain Pain location:  RLQ Pain quality: aching and dull   Pain radiates to:  Does not radiate Pain severity:  Mild Onset quality:  Sudden Duration:  5 days Timing:  Sporadic Progression:  Unchanged Chronicity:  New Context: previous surgery   Context: not awakening from sleep, not diet changes, not eating, not recent illness, not recent travel, not retching, not sick contacts, not suspicious food intake and not trauma   Relieved by:  None tried Worsened by:  Movement and position changes Ineffective treatments:  None tried Associated symptoms: no anorexia, no belching, no chest pain, no chills, no constipation, no cough, no diarrhea, no dysuria, no fatigue, no fever, no flatus, no hematemesis, no hematochezia, no hematuria, no melena, no nausea, no shortness of breath, no sore throat, no vaginal bleeding, no vaginal discharge and no vomiting   Risk factors: no alcohol abuse, has not had multiple surgeries and no NSAID use     Past Medical History:  Diagnosis Date   Basal cell carcinoma [id number]   left posterior leg    There are no problems to display  for this patient.   Past Surgical History:  Procedure Laterality Date   FRACTURE SURGERY Left    leg -childhood   HERNIA REPAIR Bilateral    8 years ago       Home Medications    Prior to Admission medications   Medication Sig Start Date End Date Taking? Authorizing Provider  albuterol (VENTOLIN HFA) 108 (90 Base) MCG/ACT inhaler SMARTSIG:1.5 Inhalation Via Inhaler Every 4-6 Hours PRN 09/20/19  Yes [provider]  finasteride (PROSCAR) 5 MG tablet  12/18/19   [provider]    Family History Family History  Problem Relation Age of Onset   Healthy Mother    Cancer Father    Cancer Sister    Healthy Brother    Healthy Brother     Social History Social History   Tobacco Use   Smoking status: Never Smoker   Smokeless tobacco: Never Used  Substance Use Topics   Alcohol use: Yes    Alcohol/week: 14.0 standard drinks    Types: 14 Glasses of wine per week    Comment: 2 a night   Drug use: Never     Allergies   Patient has no known allergies.   Review of Systems Review of Systems  Constitutional: Negative for activity change, appetite change, chills, diaphoresis, fatigue, fever and unexpected weight change.  HENT: Negative for sore throat.   Respiratory: Negative for cough and shortness of breath.   Cardiovascular: Negative for chest pain.  Gastrointestinal:  Positive for abdominal pain. Negative for anorexia, constipation, diarrhea, flatus, hematemesis, hematochezia, melena, nausea and vomiting.  Genitourinary: Negative for discharge, dysuria, flank pain, frequency, hematuria, testicular pain, urgency, vaginal bleeding and vaginal discharge.  Musculoskeletal: Negative for arthralgias and back pain.  Skin: Negative for rash.  Neurological: Negative.      Physical Exam Triage Vital Signs ED Triage Vitals  Enc Vitals Group     BP 04/08/20 1247 (!) 145/90     Pulse Rate 04/08/20 1247 62     Resp 04/08/20 1247 15     Temp  04/08/20 1247 98.2 F (36.8 C)     Temp Source 04/08/20 1247 Oral     SpO2 04/08/20 1247 100 %     Weight 04/08/20 1252 148 lb (67.1 kg)     Height 04/08/20 1252 5\' 10"  (1.778 m)     Head Circumference --      Peak Flow --      Pain Score 04/08/20 1252 0     Pain Loc --      Pain Edu? --      Excl. in [place]? --    No data found.  Updated Vital Signs BP (!) 145/90 (BP Location: Left Arm)    Pulse 62    Temp 98.2 F (36.8 C) (Oral)    Resp 15    Ht 5\' 10"  (1.778 m)    Wt 67.1 kg    SpO2 100%    BMI 21.24 kg/m   Visual Acuity Right Eye Distance:   Left Eye Distance:   Bilateral Distance:    Right Eye Near:   Left Eye Near:    Bilateral Near:     Physical Exam Constitutional:      General: He is not in acute distress.    Appearance: He is normal weight.  HENT:     Head: Normocephalic.     Right Ear: External ear normal.     Left Ear: External ear normal.     Nose: Nose normal.     Mouth/Throat:     Mouth: Mucous membranes are moist.  Eyes:     Conjunctiva/sclera: Conjunctivae normal.     Pupils: Pupils are equal, round, and reactive to light.  Cardiovascular:     Rate and Rhythm: Normal rate.     Heart sounds: Normal heart sounds.  Pulmonary:     Effort: Pulmonary effort is normal.     Breath sounds: Normal breath sounds.  Abdominal:     General: Abdomen is flat. Bowel sounds are normal.     Palpations: Abdomen is soft.     Tenderness: There is abdominal tenderness in the right lower quadrant. There is no right CVA tenderness, left CVA tenderness, guarding or rebound. Negative signs include McBurney's sign.     Hernia: No hernia is present. There is no hernia in the umbilical area, ventral area, left inguinal area, right femoral area, left femoral area or right inguinal area.       Comments: Vague tenderness to palpation right lower quadrant pain and suprapubic area most pronounced during contraction of abdominus rectus muscles.  Genitourinary:    Pubic Area: No  rash.      Penis: Normal. No tenderness, discharge, swelling or lesions.      Testes:        Right: Tenderness not present.        Left: Tenderness not present.  Musculoskeletal:     Cervical back: Normal range of motion.  Right lower leg: No edema.     Left lower leg: No edema.  Lymphadenopathy:     Cervical: No cervical adenopathy.  Skin:    General: Skin is dry.     Findings: No rash.  Neurological:     Mental Status: He is alert.      UC Treatments / Results  Labs (all labs ordered are listed, but only abnormal results are displayed) Labs Reviewed - No data to display  EKG   Radiology US PELVIS LIMITED (TRANSABDOMINAL ONLY)  Result Date: 04/08/2020 CLINICAL DATA:  Right lower quadrant suprapubic pain over the last 5 days. Superficial soft tissue ultrasound requested. History of bilateral inguinal hernia repair. EXAM: US PELVIS LIMITED TECHNIQUE: Ultrasound examination of the pelvic soft tissues was performed in the area of clinical concern. COMPARISON:  None. FINDINGS: Scanning in the region of pain does not show any superficial soft tissue abnormality or underlying bowel abnormality. IMPRESSION: Negative sonography at the site of right lower quadrant pain. Electronically Signed   By: Nelson Chimes M.D.   On: 04/08/2020 14:58    Procedures Procedures (including critical care time)  Medications Ordered in UC Medications - No data to display  Initial Impression / Assessment and Plan / UC Course  I have reviewed the triage vital signs and the nursing notes.  Pertinent labs & imaging results that were available during my care of the patient were reviewed by me and considered in my medical decision making (see chart for details).    Negative ultrasound reassuring.  Suspect abdominal muscle inflammatory process (patient recalls that he had done some abdominal crunches in a gym recently, just prior to onset of his symptoms). Given treatment instructions with range of  motion and stretching exercises.  Followup with Dr. Aundria Mems ([facility]) if not improving about two weeks.    Final Clinical Impressions(s) / UC Diagnoses   Final diagnoses:  Suprapubic abdominal pain  Abdominal muscle strain, initial encounter     Discharge Instructions     Apply ice pack for 20 to 30 minutes, 3 to 4 times daily  Continue until pain and swelling decrease.  May take Ibuprofen 200mg , 4 tabs every 8 hours with food.  Begin range of motion and stretching exercises as tolerated.    ED Prescriptions    None        Kandra Nicolas, MD 04/08/20 1759

## 2020-04-08 NOTE — ED Triage Notes (Signed)
RLQ intermittent pain since Thursday Unable to describe the pain - does not feel right Pt has had a double hernia repair 8 years ago Pt denies N/V/D No OTC meds Pt denies any constipation COVID vaccine Pf[employer] (April 2021)

## 2020-08-05 DIAGNOSIS — Z03818 Encounter for observation for suspected exposure to other biological agents ruled out: Secondary | ICD-10-CM | POA: Diagnosis not present

## 2020-08-18 DIAGNOSIS — Z125 Encounter for screening for malignant neoplasm of prostate: Secondary | ICD-10-CM | POA: Diagnosis not present

## 2020-08-18 DIAGNOSIS — Z13220 Encounter for screening for lipoid disorders: Secondary | ICD-10-CM | POA: Diagnosis not present

## 2020-08-18 DIAGNOSIS — R053 Chronic cough: Secondary | ICD-10-CM | POA: Diagnosis not present

## 2020-08-18 DIAGNOSIS — Z0000 Encounter for general adult medical examination without abnormal findings: Secondary | ICD-10-CM | POA: Diagnosis not present

## 2020-08-18 DIAGNOSIS — L649 Androgenic alopecia, unspecified: Secondary | ICD-10-CM | POA: Diagnosis not present

## 2020-09-29 DIAGNOSIS — R059 Cough, unspecified: Secondary | ICD-10-CM | POA: Diagnosis not present

## 2020-09-29 DIAGNOSIS — J3089 Other allergic rhinitis: Secondary | ICD-10-CM | POA: Diagnosis not present

## 2020-09-29 DIAGNOSIS — J301 Allergic rhinitis due to pollen: Secondary | ICD-10-CM | POA: Diagnosis not present

## 2020-09-29 DIAGNOSIS — J300 Vasomotor rhinitis: Secondary | ICD-10-CM | POA: Diagnosis not present

## 2020-11-03 ENCOUNTER — Other Ambulatory Visit (HOSPITAL_COMMUNITY): Payer: Self-pay | Admitting: *Deleted

## 2020-11-03 ENCOUNTER — Ambulatory Visit (HOSPITAL_COMMUNITY): Payer: BC Managed Care – PPO | Attending: Allergy and Immunology

## 2020-11-03 DIAGNOSIS — R0600 Dyspnea, unspecified: Secondary | ICD-10-CM

## 2020-11-03 DIAGNOSIS — R059 Cough, unspecified: Secondary | ICD-10-CM

## 2020-11-04 ENCOUNTER — Other Ambulatory Visit: Payer: Self-pay | Admitting: Allergy and Immunology

## 2020-11-04 ENCOUNTER — Other Ambulatory Visit: Payer: Self-pay

## 2020-11-04 ENCOUNTER — Ambulatory Visit
Admission: RE | Admit: 2020-11-04 | Discharge: 2020-11-04 | Disposition: A | Discharge status: Home / Self Care | Payer: BC Managed Care – PPO | Source: Ambulatory Visit | Attending: Allergy and Immunology | Admitting: Allergy and Immunology

## 2020-11-04 DIAGNOSIS — R059 Cough, unspecified: Secondary | ICD-10-CM | POA: Diagnosis not present

## 2020-11-05 DIAGNOSIS — Z91030 Bee allergy status: Secondary | ICD-10-CM | POA: Diagnosis not present

## 2020-11-21 DIAGNOSIS — Z20822 Contact with and (suspected) exposure to covid-19: Secondary | ICD-10-CM | POA: Diagnosis not present

## 2020-12-19 DIAGNOSIS — D1801 Hemangioma of skin and subcutaneous tissue: Secondary | ICD-10-CM | POA: Diagnosis not present

## 2020-12-19 DIAGNOSIS — Z85828 Personal history of other malignant neoplasm of skin: Secondary | ICD-10-CM | POA: Diagnosis not present

## 2020-12-19 DIAGNOSIS — C44519 Basal cell carcinoma of skin of other part of trunk: Secondary | ICD-10-CM | POA: Diagnosis not present

## 2020-12-19 DIAGNOSIS — L739 Follicular disorder, unspecified: Secondary | ICD-10-CM | POA: Diagnosis not present

## 2020-12-19 DIAGNOSIS — D2261 Melanocytic nevi of right upper limb, including shoulder: Secondary | ICD-10-CM | POA: Diagnosis not present

## 2020-12-19 DIAGNOSIS — L570 Actinic keratosis: Secondary | ICD-10-CM | POA: Diagnosis not present

## 2020-12-19 DIAGNOSIS — C44612 Basal cell carcinoma of skin of right upper limb, including shoulder: Secondary | ICD-10-CM | POA: Diagnosis not present

## 2020-12-19 DIAGNOSIS — D0462 Carcinoma in situ of skin of left upper limb, including shoulder: Secondary | ICD-10-CM | POA: Diagnosis not present

## 2020-12-19 DIAGNOSIS — L111 Transient acantholytic dermatosis [Grover]: Secondary | ICD-10-CM | POA: Diagnosis not present

## 2021-02-05 DIAGNOSIS — Z85828 Personal history of other malignant neoplasm of skin: Secondary | ICD-10-CM | POA: Diagnosis not present

## 2021-02-05 DIAGNOSIS — C44519 Basal cell carcinoma of skin of other part of trunk: Secondary | ICD-10-CM | POA: Diagnosis not present

## 2021-02-18 DIAGNOSIS — Z125 Encounter for screening for malignant neoplasm of prostate: Secondary | ICD-10-CM | POA: Diagnosis not present

## 2021-02-18 DIAGNOSIS — S86819A Strain of other muscle(s) and tendon(s) at lower leg level, unspecified leg, initial encounter: Secondary | ICD-10-CM | POA: Diagnosis not present

## 2021-02-18 DIAGNOSIS — Z0001 Encounter for general adult medical examination with abnormal findings: Secondary | ICD-10-CM | POA: Diagnosis not present

## 2021-02-18 DIAGNOSIS — R053 Chronic cough: Secondary | ICD-10-CM | POA: Diagnosis not present

## 2021-02-18 DIAGNOSIS — Z1211 Encounter for screening for malignant neoplasm of colon: Secondary | ICD-10-CM | POA: Diagnosis not present

## 2021-02-18 DIAGNOSIS — Z23 Encounter for immunization: Secondary | ICD-10-CM | POA: Diagnosis not present

## 2021-02-19 DIAGNOSIS — Z0001 Encounter for general adult medical examination with abnormal findings: Secondary | ICD-10-CM | POA: Diagnosis not present

## 2021-02-19 DIAGNOSIS — Z125 Encounter for screening for malignant neoplasm of prostate: Secondary | ICD-10-CM | POA: Diagnosis not present

## 2021-03-02 DIAGNOSIS — M25561 Pain in right knee: Secondary | ICD-10-CM | POA: Diagnosis not present

## 2021-03-02 DIAGNOSIS — M25552 Pain in left hip: Secondary | ICD-10-CM | POA: Diagnosis not present

## 2021-03-26 DIAGNOSIS — M25561 Pain in right knee: Secondary | ICD-10-CM | POA: Diagnosis not present

## 2021-05-06 DIAGNOSIS — R9431 Abnormal electrocardiogram [ECG] [EKG]: Secondary | ICD-10-CM | POA: Diagnosis not present

## 2021-05-21 DIAGNOSIS — J300 Vasomotor rhinitis: Secondary | ICD-10-CM | POA: Diagnosis not present

## 2021-05-21 DIAGNOSIS — R059 Cough, unspecified: Secondary | ICD-10-CM | POA: Diagnosis not present

## 2021-05-21 DIAGNOSIS — J301 Allergic rhinitis due to pollen: Secondary | ICD-10-CM | POA: Diagnosis not present

## 2021-05-21 DIAGNOSIS — J3089 Other allergic rhinitis: Secondary | ICD-10-CM | POA: Diagnosis not present

## 2022-02-25 ENCOUNTER — Ambulatory Visit
Admission: RE | Admit: 2022-02-25 | Discharge: 2022-02-25 | Disposition: A | Discharge status: Home / Self Care | Payer: HMO | Source: Ambulatory Visit | Attending: Internal Medicine | Admitting: Internal Medicine

## 2022-02-25 ENCOUNTER — Other Ambulatory Visit: Payer: Self-pay | Admitting: Internal Medicine

## 2022-02-25 DIAGNOSIS — R051 Acute cough: Secondary | ICD-10-CM

## 2022-03-16 ENCOUNTER — Ambulatory Visit (INDEPENDENT_AMBULATORY_CARE_PROVIDER_SITE_OTHER): Payer: HMO | Admitting: Internal Medicine

## 2022-03-16 ENCOUNTER — Encounter: Payer: Self-pay | Admitting: Internal Medicine

## 2022-03-16 VITALS — BP 138/76 | HR 78 | Ht 70.0 in | Wt 151.0 lb

## 2022-03-16 DIAGNOSIS — R0989 Other specified symptoms and signs involving the circulatory and respiratory systems: Secondary | ICD-10-CM | POA: Diagnosis not present

## 2022-03-16 DIAGNOSIS — Z8616 Personal history of COVID-19: Secondary | ICD-10-CM

## 2022-03-16 DIAGNOSIS — R053 Chronic cough: Secondary | ICD-10-CM | POA: Diagnosis not present

## 2022-03-16 DIAGNOSIS — J387 Other diseases of larynx: Secondary | ICD-10-CM | POA: Diagnosis not present

## 2022-03-16 NOTE — Progress Notes (Signed)
? ? ? ? ?OV 03/16/2022-referred by Dr. Fredderick Phenix for constant clearing of the throat ? ?Subjective:  ?Patient ID: Jorge Finley, male , DOB: 04/22/1956 , age 66 y.o. , MRN: [id number] , ADDRESS: [address] ?[place] [address] ?PCP Patient, No Pcp Per (Inactive) ?Patient Care Team: ?Patient, No Pcp Per (Inactive) as PCP - General (General Practice) ? ?This Provider for this visit: Treatment Team:  ?Attending Provider: Brand Males, MD ? ? ? ?03/16/2022 -   ?Chief Complaint  ?Patient presents with  ? Pulmonary Consult  ?  Referred by Dr Harold Hedge. Pt c/o cough and sputum production for a little over a year. Symptoms mainly when he exercises. He is coughing up large amounts of clear sputum.   ? ? ? ?HPI ?Jorge Finley 66 y.o. -history is provided by the patient.  Outside records not available.  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  He tells me that he has generally been quite healthy and never gotten sick.  Then in 2020 or 2020 when he did get COVID-19 that was mild.  This was before the days of the vaccine.  Prior to that he would have some occasional tickle in the throat and occasional clearing of the throat.  But now for the last 1 year sometime after the COVID infection he started having insidious onset of a lot of sinus drainage that is going to the back of the throat with head feeling full.  He feels there is a lot of sputum.  This is resulting in clearing of the throat.  It is described as severe.  It does not bother him at night when he sleeps.  Early in the morning also he is not bothered by it.  Then as the day progresses he gets it.  When he exercises or goes for his run -his symptoms get worse.  A few weeks ago he did seem to have a flareup for which she was given prednisone sounded like acute bronchitis at that time was also having shortness of breath otherwise typically does not  have shortness of breath.  The prednisone seemed to help partially. ? ?He has seen Dr. Fredderick Phenix the allergist for this.  According to him exam nitric oxide test was negative.  Skin allergy test was negative except for mild allergies to trees.  He has never seen GI he has never seen ENT. ? ?He had CPST Jan 2022 and norma.l Conclusion: Exercise testing with gas exchange demonstrates normal (actually excellent) functional capacity when compared to matched sedentary norms. There is no indication for cardiopulmonary limitation or exercise-induced bronchospasm. Patient is achieving his ventilatory limits and shows excellent training effect with high anaerobic threshold.   ?Test, report and preliminary impression by:  ?Landis Martins, MS, ACSM-RCEP  ?11/03/2020 5:09 PM  ? ? ? ?CT Chest data ? ?No results found. ? Latest Reference Range & Units 12/04/10 09:35  ?Eosinophils Absolute 0.0 - 0.7 K/uL 0.2  ? ? ?CXR April 2023 - visualized ? ?Narrative & Impression  ?CLINICAL DATA:  Acute cough.  Chest congestion. ?  ?EXAM: ?CHEST - 2 VIEW ?  ?COMPARISON:  Chest two views 11/04/2020 ?  ?FINDINGS: ?Cardiac silhouette and mediastinal contours are within normal ?limits. The lungs are clear. Minimal biapical scarring is unchanged. ?No pleural effusion or pneumothorax. Minimal multilevel degenerative ?disc changes of the thoracic spine. ?  ?IMPRESSION: ?No  active cardiopulmonary disease. ?  ?  ?Electronically Signed ?  By: Yvonne Kendall M.D. ?  On: 02/25/2022 12:16  ? ? ?PFT ? ?   ? View : No data to display.  ?  ?  ?  ? ? ? ? ? has a past medical history of Basal cell carcinoma ([id number]). ? ? reports that he has never smoked. He has never used smokeless tobacco. ? ?Past Surgical History:  ?Procedure Laterality Date  ? FRACTURE SURGERY Left   ? leg -childhood  ? HERNIA REPAIR Bilateral   ? 8 years ago  ? ? ?No Known Allergies ? ?Immunization History  ?Administered Date(s) Administered  ? PFIZER(Purple Top)SARS-COV-2 Vaccination  01/03/2020, 01/25/2020  ? Pneumococcal Polysaccharide-23 02/18/2021  ? Tdap 04/29/2019  ? ? ?Family History  ?Problem Relation Age of Onset  ? Healthy Mother   ? Cancer Father   ?     prostate  ? Cancer Sister   ? Healthy Brother   ? Healthy Brother   ? Asthma Daughter   ? ? ? ?Current Outpatient Medications:  ?  albuterol (VENTOLIN HFA) 108 (90 Base) MCG/ACT inhaler, SMARTSIG:1.5 Inhalation Via Inhaler Every 4-6 Hours PRN, Disp: , Rfl:  ?  fexofenadine (ALLEGRA) 180 MG tablet, Take 180 mg by mouth daily., Disp: , Rfl:  ?  fluticasone (FLOVENT HFA) 110 MCG/ACT inhaler, Inhale 2 puffs into the lungs daily., Disp: , Rfl:  ?  guaifenesin (HUMIBID E) 400 MG TABS tablet, Take 400 mg by mouth every 6 (six) hours as needed., Disp: , Rfl:  ? ? ?   ?Objective:  ? ?Vitals:  ? 03/16/22 1605  ?BP: 138/76  ?Pulse: 78  ?SpO2: 97%  ?Weight: 151 lb (68.5 kg)  ?Height: '5\' 10"'$  (1.778 m)  ? ? ?Estimated body mass index is 21.67 kg/m? as calculated from the following: ?  Height as of this encounter: '5\' 10"'$  (1.778 m). ?  Weight as of this encounter: 151 lb (68.5 kg). ? ?'@WEIGHTCHANGE'$ @ ? ?Filed Weights  ? 03/16/22 1605  ?Weight: 151 lb (68.5 kg)  ? ? ? Physical Exam ? ?General Appearance:    Alert, cooperative, no distress, appears stated age - thin well built , Deconditioned looking - no , OBESE  - no, Sitting on Wheelchair -  no  ?Head:    Normocephalic, without obvious abnormality, atraumatic  ?Eyes:    PERRL, conjunctiva/corneas clear,  ?Ears:    Normal TM's and external ear canals, both ears  ?Nose:   Nares normal, septum midline, mucosa normal, no drainage  ?  or sinus tenderness. OXYGEN ON  - no . Patient is @ ra   ?Throat:   Lips, mucosa, and tongue normal; teeth and gums normal. Cyanosis on lips - no  ?Neck:   Supple, symmetrical, trachea midline, no adenopathy;  ?  thyroid:  no enlargement/tenderness/nodules; no carotid ?  bruit or JVD  ?Back:     Symmetric, no curvature, ROM normal, no CVA tenderness  ?Lungs:     Distress - no  , Wheeze no, Barrell Chest - no, Purse lip breathing - no, Crackles - no   ?Chest Wall:    No tenderness or deformity.   ? Heart:    Regular rate and rhythm, S1 and S2 normal, no rub ?  or gallop, Murmur - no  ?Breast Exam:    NOT DONE  ?Abdomen:     Soft, non-tender, bowel sounds active all four quadrants,  ?  no masses, no organomegaly. Visceral  obesity - no  ?Genitalia:   NOT DONE  ?Rectal:   NOT DONE  ?Extremities:   Extremities - normal, Has Cane - no, Clubbing - no, Edema - no  ?Pulses:   2+ and symmetric all extremities  ?Skin:   Stigmata of Connective Tissue Disease - no  ?Lymph nodes:   Cervical, supraclavicular, and axillary nodes normal  ?Psychiatric:  ?Neurologic:   Pleasant - yes, Anxious - no, Flat affect - no ? CAm-ICU - neg, Alert and Oriented x 3 - yes, Moves all 4s - yes, Speech - normal, Cognition - intact  ? ? ? ?   ?Assessment:  ?   ?  ICD-10-CM   ?1. Chronic cough  R05.3 Pulmonary Function Test  ?  CT Chest High Resolution  ?  CT MAXILLOFACIAL LTD WO CM  ?  Ambulatory referral to ENT  ?  ?2. Irritable larynx  J38.7 Ambulatory referral to ENT  ?  ?3. Chronic sinus complaints  R09.89 CT MAXILLOFACIAL LTD WO CM  ?  ?4. History of 2019 novel coronavirus disease (COVID-19)  Z86.16   ?  ?5. Chronic throat clearing  R09.89 Ambulatory referral to ENT  ?  ? ? ?   ?Plan:  ?   ?Patient Instructions  ? ?  ICD-10-CM   ?1. Chronic cough  R05.3   ?  ?2. 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  ?3. Chronic sinus complaints  R09.89   ?  ?4. History of 2019 novel coronavirus disease (COVID-19)  Z86.16   ?  ?5. Chronic throat clearing  R09.89   ?  ? ? ?Cough is from uncertain causes. All of this is working together to cause cyclical cough/LPR cough or clearing of throat syndrome or cough neuropathy ? ?Plan  ? - please choose 2-3 days and observe complete voice rest - no talking or whispering ? - at all times there  there is urge to cough, drink water or swallow or sip on throat lozenge ?- do HRCT supine and prone ?- do CT  sinus without contrast ?- do full PFT ? - refer Dr Blenda Nicely or Dr Elgie Congo ENT ? ? ?#Followup ?- APP Tammy OR I will see you in 4 weeks.- 5 weeks ? - if results non-contributory consider gabapentin and refer

## 2022-03-16 NOTE — Patient Instructions (Addendum)
ICD-10-CM   ?1. Chronic cough  R05.3   ?  ?2. 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  ?3. Chronic sinus complaints  R09.89   ?  ?4. History of 2019 novel coronavirus disease (COVID-19)  Z86.16   ?  ?5. Chronic throat clearing  R09.89   ?  ? ? ?Cough is from uncertain causes. All of this is working together to cause cyclical cough/LPR cough or clearing of throat syndrome or cough neuropathy ? ?Plan  ? - please choose 2-3 days and observe complete voice rest - no talking or whispering ? - at all times there  there is urge to cough, drink water or swallow or sip on throat lozenge ?- do HRCT supine and prone ?- do CT sinus without contrast ?- do full PFT ? - refer Dr Blenda Nicely or Dr Elgie Congo ENT ? ? ?#Followup ?- APP Tammy OR I will see you in 4 weeks.- 5 weeks ? - if results non-contributory consider gabapentin and referral to voice rehab ? ?

## 2022-03-30 ENCOUNTER — Ambulatory Visit
Admission: RE | Admit: 2022-03-30 | Discharge: 2022-03-30 | Disposition: A | Discharge status: Home / Self Care | Payer: HMO | Source: Ambulatory Visit | Attending: Internal Medicine | Admitting: Internal Medicine

## 2022-03-30 ENCOUNTER — Ambulatory Visit (INDEPENDENT_AMBULATORY_CARE_PROVIDER_SITE_OTHER)
Admission: RE | Admit: 2022-03-30 | Discharge: 2022-03-30 | Disposition: A | Discharge status: Home / Self Care | Payer: HMO | Source: Ambulatory Visit | Attending: Internal Medicine | Admitting: Internal Medicine

## 2022-03-30 DIAGNOSIS — R0989 Other specified symptoms and signs involving the circulatory and respiratory systems: Secondary | ICD-10-CM

## 2022-03-30 DIAGNOSIS — R053 Chronic cough: Secondary | ICD-10-CM

## 2022-04-12 ENCOUNTER — Other Ambulatory Visit: Payer: Self-pay | Admitting: Internal Medicine

## 2022-04-12 DIAGNOSIS — I2510 Atherosclerotic heart disease of native coronary artery without angina pectoris: Secondary | ICD-10-CM

## 2022-05-03 ENCOUNTER — Ambulatory Visit (INDEPENDENT_AMBULATORY_CARE_PROVIDER_SITE_OTHER): Payer: HMO | Admitting: Adult Health

## 2022-05-03 ENCOUNTER — Ambulatory Visit (INDEPENDENT_AMBULATORY_CARE_PROVIDER_SITE_OTHER): Payer: HMO | Admitting: Internal Medicine

## 2022-05-03 ENCOUNTER — Encounter: Payer: Self-pay | Admitting: Adult Health

## 2022-05-03 DIAGNOSIS — R053 Chronic cough: Secondary | ICD-10-CM

## 2022-05-03 DIAGNOSIS — R918 Other nonspecific abnormal finding of lung field: Secondary | ICD-10-CM | POA: Diagnosis not present

## 2022-05-03 LAB — PULMONARY FUNCTION TEST
DL/VA % pred: 100 %
DL/VA: 4.13 ml/min/mmHg/L
DLCO cor % pred: 94 %
DLCO cor: 25.03 ml/min/mmHg
DLCO unc % pred: 94 %
DLCO unc: 25.03 ml/min/mmHg
FEF 25-75 Post: 3.33 L/sec
FEF 25-75 Pre: 2.8 L/sec
FEF2575-%Change-Post: 18 %
FEF2575-%Pred-Post: 124 %
FEF2575-%Pred-Pre: 104 %
FEV1-%Change-Post: 5 %
FEV1-%Pred-Post: 102 %
FEV1-%Pred-Pre: 96 %
FEV1-Post: 3.47 L
FEV1-Pre: 3.29 L
FEV1FVC-%Change-Post: 7 %
FEV1FVC-%Pred-Pre: 104 %
FEV6-%Change-Post: -2 %
FEV6-%Pred-Post: 95 %
FEV6-%Pred-Pre: 97 %
FEV6-Post: 4.14 L
FEV6-Pre: 4.24 L
FEV6FVC-%Change-Post: 0 %
FEV6FVC-%Pred-Post: 105 %
FEV6FVC-%Pred-Pre: 105 %
FVC-%Change-Post: -2 %
FVC-%Pred-Post: 90 %
FVC-%Pred-Pre: 92 %
FVC-Post: 4.14 L
FVC-Pre: 4.24 L
Post FEV1/FVC ratio: 84 %
Post FEV6/FVC ratio: 100 %
Pre FEV1/FVC ratio: 78 %
Pre FEV6/FVC Ratio: 100 %
RV % pred: 90 %
RV: 2.15 L
TLC % pred: 96 %
TLC: 6.8 L

## 2022-05-03 MED ORDER — BENZONATATE 200 MG PO CAPS: 200.0000 mg | ORAL_CAPSULE | Freq: Three times a day (TID) | ORAL | 1 refills | Status: DC | PRN

## 2022-05-03 NOTE — Progress Notes (Signed)
Full PFT performed today. °

## 2022-05-03 NOTE — Patient Instructions (Signed)
Full PFT performed today. °

## 2022-05-03 NOTE — Progress Notes (Signed)
'@Patient'  ID: Jorge Finley, male    DOB: 02-01-1956, 66 y.o.   MRN: [id number]  Chief Complaint  Patient presents with   Follow-up    Referring provider: No ref. provider found  HPI: 66 year old male never smoker seen for pulmonary consult Mar 16, 2022 for chronic cough x 2 years.  TEST/EVENTS :   05/03/2022 Follow up : Chronic cough  Patient presents for a 6-week follow-up.  Patient complains over the last 2-year he has had ongoing cough that is minimally productive and mostly chronic throat clearing.  Symptoms seem to have gotten worse after he had COVID-19 infection.  Patient complains of incessant throat clearing.  Patient says he exercises on a daily basis is very frustrated as the throat clearing and cough are very aggravating during his exercise especially when he exercises heavily.  Patient has tried Flovent without much help.  Previously treated with prednisone but did not make much different.  Patient says he recently went to ENT and told that he had GERD and was started on Prilosec and Pepcid.  Patient had PFTs done today that show normal lung function with no airflow obstruction or restriction and a normal diffusing capacity.  FEV1 was 102%, ratio 84, FVC 90%, no significant bronchodilator response, DLCO 94%.  High-resolution CT chest was done on Mar 30, 2022 that showed no evidence of interstitial lung disease or acute process.  Mild bilateral air trapping noted.  Small bilateral solid pulmonary nodules measuring up to 4 mm.  Incidental finding of three-vessel coronary artery calcifications.  Patient has been referred to cardiology and has an upcoming dedicated cardiac CT screening.  CT sinus negative.  Does have some postnasal drainage.  Denies any hemoptysis, chest pain, orthopnea, edema  No Known Allergies  Immunization History  Administered Date(s) Administered   PFIZER(Purple Top)SARS-COV-2 Vaccination 01/03/2020, 01/25/2020   Pneumococcal Polysaccharide-23 02/18/2021    Tdap 04/29/2019    Past Medical History:  Diagnosis Date   Basal cell carcinoma [id number]   left posterior leg    Tobacco History: Social History   Tobacco Use  Smoking Status Never  Smokeless Tobacco Never   Counseling given: Not Answered   Outpatient Medications Prior to Visit  Medication Sig Dispense Refill   albuterol (VENTOLIN HFA) 108 (90 Base) MCG/ACT inhaler SMARTSIG:1.5 Inhalation Via Inhaler Every 4-6 Hours PRN     fexofenadine (ALLEGRA) 180 MG tablet Take 180 mg by mouth daily.     guaifenesin (HUMIBID E) 400 MG TABS tablet Take 400 mg by mouth every 6 (six) hours as needed.     fluticasone (FLOVENT HFA) 110 MCG/ACT inhaler Inhale 2 puffs into the lungs daily. (Patient not taking: Reported on 05/03/2022)     No facility-administered medications prior to visit.     Review of Systems:   Constitutional:   No  weight loss, night sweats,  Fevers, chills, fatigue, or  lassitude.  HEENT:   No headaches,  Difficulty swallowing,  Tooth/dental problems, or  Sore throat,                No sneezing, itching, ear ache,  +nasal congestion, post nasal drip,   CV:  No chest pain,  Orthopnea, PND, swelling in lower extremities, anasarca, dizziness, palpitations, syncope.   GI  No heartburn, indigestion, abdominal pain, nausea, vomiting, diarrhea, change in bowel habits, loss of appetite, bloody stools.   Resp: .  No chest wall deformity  Skin: no rash or lesions.  GU: no dysuria, change in color of  urine, no urgency or frequency.  No flank pain, no hematuria   MS:  No joint pain or swelling.  No decreased range of motion.  No back pain.    Physical Exam  BP 118/64 (BP Location: Left Arm, Patient Position: Sitting, Cuff Size: Normal)   Pulse 61   Temp 97.7 F (36.5 C) (Oral)   Ht '5\' 10"'  (1.778 m)   Wt 148 lb (67.1 kg)   SpO2 97%   BMI 21.24 kg/m   GEN: A/Ox3; pleasant , NAD, well nourished    HEENT:  [place]/AT,  NOSE-clear, THROAT-clear, no lesions, no postnasal  drip or exudate noted. +throat clearing during exam   NECK:  Supple w/ fair ROM; no JVD; normal carotid impulses w/o bruits; no thyromegaly or nodules palpated; no lymphadenopathy.    RESP  Clear  P & A; w/o, wheezes/ rales/ or rhonchi. no accessory muscle use, no dullness to percussion  CARD:  RRR, no m/r/g, no peripheral edema, pulses intact, no cyanosis or clubbing.  GI:   Soft & nt; nml bowel sounds; no organomegaly or masses detected.   Musco: Warm bil, no deformities or joint swelling noted.   Neuro: alert, no focal deficits noted.    Skin: Warm, no lesions or rashes    Lab Results:  CBC    Component Value Date/Time   WBC 4.9 12/04/2010 0935   RBC 4.45 12/04/2010 0935   HGB 14.0 12/04/2010 0935   HCT 40.0 12/04/2010 0935   PLT 205 12/04/2010 0935   MCV 89.9 12/04/2010 0935   MCH 31.5 12/04/2010 0935   MCHC 35.0 12/04/2010 0935   RDW 12.3 12/04/2010 0935   LYMPHSABS 1.4 12/04/2010 0935   MONOABS 0.7 12/04/2010 0935   EOSABS 0.2 12/04/2010 0935   BASOSABS 0.0 12/04/2010 0935    BMET    Component Value Date/Time   NA 139 12/04/2010 0935   K 4.0 12/04/2010 0935   CL 104 12/04/2010 0935   CO2 28 12/04/2010 0935   GLUCOSE 65 (L) 12/04/2010 0935   BUN 11 12/04/2010 0935   CREATININE 0.97 12/04/2010 0935   CALCIUM 9.4 12/04/2010 0935   GFRNONAA >60 12/04/2010 0935   GFRAA  12/04/2010 0935    >60        The eGFR has been calculated using the MDRD equation. This calculation has not been validated in all clinical situations. eGFR's persistently <60 mL/min signify possible Chronic Kidney Disease.    BNP No results found for: "BNP"  ProBNP No results found for: "PROBNP"  Imaging: No results found.       Latest Ref Rng & Units 05/03/2022   11:01 AM  PFT Results  FVC-Pre L 4.24  P  FVC-Predicted Pre % 92  P  FVC-Post L 4.14  P  FVC-Predicted Post % 90  P  Pre FEV1/FVC % % 78  P  Post FEV1/FCV % % 84  P  FEV1-Pre L 3.29  P  FEV1-Predicted Pre %  96  P  FEV1-Post L 3.47  P  DLCO uncorrected ml/min/mmHg 25.03  P  DLCO UNC% % 94  P  DLCO corrected ml/min/mmHg 25.03  P  DLCO COR %Predicted % 94  P  DLVA Predicted % 100  P  TLC L 6.80  P  TLC % Predicted % 96  P  RV % Predicted % 90  P    P Preliminary result    No results found for: "NITRICOXIDE"      Assessment & Plan:  No problem-specific Assessment & Plan notes found for this encounter.     Rexene Edison, NP 05/03/2022

## 2022-05-03 NOTE — Patient Instructions (Signed)
Delsym 2 tsp Twice daily  for cough/throat clearing As needed   Tessalon Three times a day  for cough /throat clearing as needed  Allegra daily  Chlorpheniramine '4mg'$  At bedtime for throat clearing As needed   Continue on Prilosec and Pepcid  GERD diet  Follow up with Cardiology as planned.  NO Mints.  Use sips of water to soothe throat and avoid throat clearing  Follow up with Dr. Chase Caller in 3 months and As needed

## 2022-05-09 DIAGNOSIS — R053 Chronic cough: Secondary | ICD-10-CM | POA: Insufficient documentation

## 2022-05-09 DIAGNOSIS — R918 Other nonspecific abnormal finding of lung field: Secondary | ICD-10-CM | POA: Insufficient documentation

## 2022-05-09 NOTE — Assessment & Plan Note (Addendum)
Incidental lung nodules seen on CT chest 03/2022, very small 4 mm max size. Never smoker. Per guidelines, no additional imaging is indicated at this time.   Incidental finding of CAD calcifications - recommend cont follow up with cards as planned

## 2022-05-09 NOTE — Assessment & Plan Note (Signed)
Chronic cough for 2 years , suspect upper airway irritability with post nasal drip and GERD . Needs aggressive cough control regimen , trigger prevention. PFTs normal . HRCT chest negative for ILD .   Plan  Patient Instructions  Delsym 2 tsp Twice daily  for cough/throat clearing As needed   Tessalon Three times a day  for cough /throat clearing as needed  Allegra daily  Chlorpheniramine '4mg'$  At bedtime for throat clearing As needed   Continue on Prilosec and Pepcid  GERD diet  Follow up with Cardiology as planned.  NO Mints.  Use sips of water to soothe throat and avoid throat clearing  Follow up with Dr. Chase Caller in 3 months and As needed

## 2022-06-16 ENCOUNTER — Other Ambulatory Visit: Payer: Self-pay | Admitting: Internal Medicine

## 2022-06-17 ENCOUNTER — Ambulatory Visit
Admission: RE | Admit: 2022-06-17 | Discharge: 2022-06-17 | Disposition: A | Discharge status: Home / Self Care | Payer: HMO | Source: Ambulatory Visit | Attending: Internal Medicine | Admitting: Internal Medicine

## 2022-06-17 DIAGNOSIS — I2510 Atherosclerotic heart disease of native coronary artery without angina pectoris: Secondary | ICD-10-CM

## 2022-07-08 ENCOUNTER — Encounter: Payer: Self-pay | Admitting: Internal Medicine

## 2022-07-08 ENCOUNTER — Ambulatory Visit: Payer: HMO | Attending: Internal Medicine | Admitting: Internal Medicine

## 2022-07-08 ENCOUNTER — Telehealth: Payer: Self-pay | Admitting: Internal Medicine

## 2022-07-08 VITALS — BP 120/70 | HR 63 | Ht 70.0 in | Wt 150.0 lb

## 2022-07-08 DIAGNOSIS — I2510 Atherosclerotic heart disease of native coronary artery without angina pectoris: Secondary | ICD-10-CM

## 2022-07-08 DIAGNOSIS — E785 Hyperlipidemia, unspecified: Secondary | ICD-10-CM

## 2022-07-08 DIAGNOSIS — R931 Abnormal findings on diagnostic imaging of heart and coronary circulation: Secondary | ICD-10-CM | POA: Diagnosis not present

## 2022-07-08 NOTE — Telephone Encounter (Signed)
Genetic test for dyslipidemia/ASCVD panel ordered ([facility]) Cheek swab completed in office Specimen and necessary paperwork mailed.

## 2022-07-08 NOTE — Patient Instructions (Signed)
Medication Instructions:  NO CHANGES today -- will depend on LPa test and genetic test  *If you need a refill on your cardiac medications before your next appointment, please call your pharmacy*   Lab Work: LPa test today   FASTING cholesterol testing in 4 months -- before next appointment  If you have labs (blood work) drawn today and your tests are completely normal, you will receive your results only by: [place] (if you have MyChart) OR A paper copy in the mail If you have any lab test that is abnormal or we need to change your treatment, we will call you to review the results.   Testing/Procedures: Genetic Test -- results available in 2-3 weeks   Follow-Up: At [facility], you and your health needs are our priority.  As part of our continuing mission to provide you with exceptional heart care, we have created designated Provider Care Teams.  These Care Teams include your primary Cardiologist (physician) and Advanced Practice Providers (APPs -  Physician Assistants and Nurse Practitioners) who all work together to provide you with the care you need, when you need it.  We recommend signing up for the patient portal called "MyChart".  Sign up information is provided on this After Visit Summary.  MyChart is used to connect with patients for Virtual Visits (Telemedicine).  Patients are able to view lab/test results, encounter notes, upcoming appointments, etc.  Non-urgent messages can be sent to your provider as well.   To learn more about what you can do with MyChart, go to [email or web address].    Your next appointment:   4 month(s)  The format for your next appointment:   In Person  Provider:   Lyman Bishop MD

## 2022-07-08 NOTE — Progress Notes (Signed)
LIPID CLINIC CONSULT NOTE  Chief Complaint:  Manage dyslipidemia  Primary Care Physician: Patient, No Pcp Per  Primary Cardiologist:  None  HPI:  Jorge Finley is a 66 y.o. male who is being seen today for the evaluation of dyslipidemia at the request of Jorge Huddle, MD. this is a pleasant 67 year old male with little past medical history.  He reports a fairly active and healthy lifestyle.  He says he is try to reduce saturated fats in his diet more recently.  He has exercised and competitively training for a number of years.  He does note that his father had a stroke in his 45s and then another stroke late in his 52s.  More recently he was having issues with breathing during exercise, particularly running.  It was thought that he might be having reflux symptoms.  He was placed on medications for this and had a CT scan of the lungs which showed coronary artery calcification.  Subsequently was sent for dedicated CT scan that showed a very high calcium score of 694, 86 percentile for age and sex matched controls.  He had coronary calcium of all 3 coronaries including the left main.  He then had a lipid NMR which actually showed surprisingly reasonable untreated cholesterol.  Total of 979 for LDL particle number, total cholesterol 168, triglycerides 63, HDL 65 and LDL 91.  His small LDL particle number was 308.  Subsequently was placed per guidelines on high intensity statin therapy, rosuvastatin 20 mg daily which she is taken for about 2-1/2 weeks.  He is also taking low-dose aspirin.  He is asymptomatic with exercise other than he does get this scratchiness/mild tightness in his throat with exertion but also without exertion.  Its not clear that this is angina is not limiting to his exercise.  PMHx:  Past Medical History:  Diagnosis Date   Basal cell carcinoma [id number]   left posterior leg    Past Surgical History:  Procedure Laterality Date   FRACTURE SURGERY Left    leg -childhood    HERNIA REPAIR Bilateral    8 years ago    FAMHx:  Family History  Problem Relation Age of Onset   Healthy Mother    Cancer Father        prostate   Cancer Sister    Healthy Brother    Healthy Brother    Asthma Daughter     SOCHx:   reports that he has never smoked. He has never used smokeless tobacco. He reports current alcohol use of about 14.0 standard drinks of alcohol per week. He reports that he does not use drugs.  ALLERGIES:  No Known Allergies  ROS: Pertinent items noted in HPI and remainder of comprehensive ROS otherwise negative.  HOME MEDS: Current Outpatient Medications on File Prior to Visit  Medication Sig Dispense Refill   albuterol (VENTOLIN HFA) 108 (90 Base) MCG/ACT inhaler SMARTSIG:1.5 Inhalation Via Inhaler Every 4-6 Hours PRN     ASPIRIN 81 PO Take 81 mg by mouth daily.     famotidine (PEPCID) 20 MG tablet Take 20 mg by mouth daily.     fexofenadine (ALLEGRA) 180 MG tablet Take 180 mg by mouth daily.     guaifenesin (HUMIBID E) 400 MG TABS tablet Take 400 mg by mouth every 6 (six) hours as needed.     omeprazole (PRILOSEC) 40 MG capsule Take 40 mg by mouth daily.     rosuvastatin (CRESTOR) 20 MG tablet Take 20 mg by mouth  daily.     No current facility-administered medications on file prior to visit.    LABS/IMAGING: No results found for this or any previous visit (from the past 48 hour(s)). No results found.  LIPID PANEL: No results found for: "CHOL", "TRIG", "HDL", "CHOLHDL", "VLDL", "LDLCALC", "LDLDIRECT"  WEIGHTS: Wt Readings from Last 3 Encounters:  07/08/22 150 lb (68 kg)  05/03/22 148 lb (67.1 kg)  03/16/22 151 lb (68.5 kg)    VITALS: BP 120/70 (BP Location: Left Arm, Patient Position: Sitting, Cuff Size: Normal)   Pulse 63   Ht '5\' 10"'$  (1.778 m)   Wt 150 lb (68 kg)   SpO2 98%   BMI 21.52 kg/m   EXAM: General appearance: alert and no distress Neck: no carotid bruit, no JVD, and thyroid not enlarged, symmetric, no  tenderness/mass/nodules Lungs: clear to auscultation bilaterally Heart: regular rate and rhythm, S1, S2 normal, no murmur, click, rub or gallop Abdomen: soft, non-tender; bowel sounds normal; no masses,  no organomegaly Extremities: extremities normal, atraumatic, no cyanosis or edema Pulses: 2+ and symmetric Skin: Skin color, texture, turgor normal. No rashes or lesions Neurologic: Grossly normal Psych: Pleasant  EKG: Sinus rhythm with marked sinus rhythm at 63, possible left atrial lodgment- personally reviewed  ASSESSMENT: Mixed dyslipidemia, goal LDL less than 70 Elevated CAC score 694, 67 percentile for age and sex matched controls Family history of premature cardiovascular disease-father, stroke in his 65s  PLAN: 1.   [name] has a mixed dyslipidemia and remains above target.  I agree with high intensity statin therapy which she was started on.  However I agree his lipid profile is not that compelling as a reason for why he has such extensive coronary artery disease despite a healthy lifestyle and regular exercise.  There is certainly a genetic component to this.  His father did have a early onset cerebrovascular disease.  I would like to get a genetic test today which would hopefully shed some light on this.  We will also check an LP(a) which I suspect may be elevated and if so would try to see if we can get him on a PCSK9 inhibitor.  Thanks again for this interesting referral.  Plan follow-up with me with repeat lipids in about 3 to 4 months.  [name], MD, [facility], [place] Director of the [facility] Lipid Disorders &  Cardiovascular Risk Reduction Clinic Diplomate of the American Board of Clinical Lipidology Attending Cardiologist  Direct Dial: [phone]  Fax: [phone]  Website:  [name] 07/08/2022, 2:54 PM

## 2022-07-09 LAB — LIPOPROTEIN A (LPA): Lipoprotein (a): 33.7 nmol/L (ref <75.0)

## 2022-07-30 ENCOUNTER — Encounter: Payer: Self-pay | Admitting: Internal Medicine

## 2022-08-03 ENCOUNTER — Encounter: Payer: Self-pay | Admitting: Internal Medicine

## 2022-08-03 ENCOUNTER — Ambulatory Visit (INDEPENDENT_AMBULATORY_CARE_PROVIDER_SITE_OTHER): Payer: HMO | Admitting: Internal Medicine

## 2022-08-03 VITALS — BP 132/74 | HR 67 | Temp 98.0°F | Ht 70.0 in | Wt 150.0 lb

## 2022-08-03 DIAGNOSIS — J387 Other diseases of larynx: Secondary | ICD-10-CM

## 2022-08-03 DIAGNOSIS — R053 Chronic cough: Secondary | ICD-10-CM

## 2022-08-03 DIAGNOSIS — R0989 Other specified symptoms and signs involving the circulatory and respiratory systems: Secondary | ICD-10-CM

## 2022-08-03 MED ORDER — GABAPENTIN 300 MG PO CAPS
300.0000 mg | ORAL_CAPSULE | Freq: Three times a day (TID) | ORAL | 1 refills | Status: DC
Start: 2022-08-03 — End: 2022-11-10

## 2022-08-03 NOTE — Patient Instructions (Addendum)
ICD-10-CM   1. Chronic cough  R05.3     2. Irritable larynx  J38.7     3. 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 If this makes you too sleepy or drowsy call us and we will cut your medication dosing down -Suck on sugarless lozenge as needed - Any urge to cough to clear your throat drink water - Cotninue  Delsym 2 tsp Twice daily  for cough/throat clearing As needed   - Continue Tessalon Three times a day  for cough /throat clearing as needed  -Recruitment consultant and Pepcid as before  Follow-up - 4 to 6 weeks with Dr. Chase Caller 15-minute visit [video visit or face-to-face as desired]  -Can see Patricia Nettle if my schedule is full  -RSI cough score at follow-up

## 2022-08-03 NOTE — Progress Notes (Signed)
OV 03/16/2022-referred by Dr. Fredderick Phenix for constant clearing of the throat  Subjective:  Patient ID: Jorge Finley, male , DOB: 1955-12-02 , age 66 y.o. , MRN: [id number] , ADDRESS: [place] [id number] PCP Patient, No Pcp Per (Inactive) Patient Care Team: Patient, No Pcp Per (Inactive) as PCP - General (General Practice)  This Provider for this visit: Treatment Team:  Attending Provider: Brand Males, MD    03/16/2022 -   Chief Complaint  Patient presents with   Pulmonary Consult    Referred by Dr Harold Hedge. Pt c/o cough and sputum production for a little over a year. Symptoms mainly when he exercises. He is coughing up large amounts of clear sputum.      HPI Jorge Finley 66 y.o. -history is provided by the patient.  Outside records not available.  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  He tells me that he has generally been quite healthy and never gotten sick.  Then in 2020 or 2020 when he did get COVID-19 that was mild.  This was before the days of the vaccine.  Prior to that he would have some occasional tickle in the throat and occasional clearing of the throat.  But now for the last 1 year sometime after the COVID infection he started having insidious onset of a lot of sinus drainage that is going to the back of the throat with head feeling full.  He feels there is a lot of sputum.  This is resulting in clearing of the throat.  It is described as severe.  It does not bother him at night when he sleeps.  Early in the morning also he is not bothered by it.  Then as the day progresses he gets it.  When he exercises or goes for his run -his symptoms get worse.  A few weeks ago he did seem to have a flareup for which she was given prednisone sounded like acute bronchitis at that time was also having shortness of breath otherwise typically does not  have shortness of breath.  The prednisone seemed to help partially.  He has seen Dr. Fredderick Phenix the allergist for this.  According to him exam nitric oxide test was negative.  Skin allergy test was negative except for mild allergies to trees.  He has never seen GI he has never seen ENT.  He had CPST Jan 2022 and norma.l Conclusion: Exercise testing with gas exchange demonstrates normal (actually excellent) functional capacity when compared to matched sedentary norms. There is no indication for cardiopulmonary limitation or exercise-induced bronchospasm. Patient is achieving his ventilatory limits and shows excellent training effect with high anaerobic threshold.   Test, report and preliminary impression by:  Landis Martins, MS, ACSM-RCEP  11/03/2020 5:09 PM     CT Chest data  No results found.  Latest Reference Range & Units 12/04/10 09:35  Eosinophils Absolute 0.0 - 0.7 K/uL 0.2    CXR April 2023 - visualized  Narrative & Impression  CLINICAL DATA:  Acute cough.  Chest congestion.   EXAM: CHEST - 2 VIEW   COMPARISON:  Chest two views 11/04/2020   FINDINGS: Cardiac silhouette and mediastinal contours are within normal limits. The lungs are clear. Minimal biapical scarring is unchanged. No pleural effusion or pneumothorax. Minimal multilevel degenerative disc changes of the thoracic spine.   IMPRESSION: No  active cardiopulmonary disease.     Electronically Signed   By: Yvonne Kendall M.D.   On: 02/25/2022 12:16    05/03/2022 Follow up : Chronic cough  Patient presents for a 6-week follow-up.  Patient complains over the last 2-year he has had ongoing cough that is minimally productive and mostly chronic throat clearing.  Symptoms seem to have gotten worse after he had COVID-19 infection.  Patient complains of incessant throat clearing.  Patient says he exercises on a daily basis is very frustrated as the throat clearing and cough are very aggravating during his exercise especially  when he exercises heavily.  Patient has tried Flovent without much help.  Previously treated with prednisone but did not make much different.  Patient says he recently went to ENT and told that he had GERD and was started on Prilosec and Pepcid.  Patient had PFTs done today that show normal lung function with no airflow obstruction or restriction and a normal diffusing capacity.  FEV1 was 102%, ratio 84, FVC 90%, no significant bronchodilator response, DLCO 94%.  High-resolution CT chest was done on Mar 30, 2022 that showed no evidence of interstitial lung disease or acute process.  Mild bilateral air trapping noted.  Small bilateral solid pulmonary nodules measuring up to 4 mm.  Incidental finding of three-vessel coronary artery calcifications.  Patient has been referred to cardiology and has an upcoming dedicated cardiac CT screening.  CT sinus negative.  Does have some postnasal drainage.  Denies any hemoptysis, chest pain, orthopnea, edema  OV 08/03/2022  Subjective:  Patient ID: Jorge Finley, male , DOB: 09-17-1956 , age 66 y.o. , MRN: [id number] , ADDRESS: [place] [id number] PCP Patient, No Pcp Per Patient Care Team: Patient, No Pcp Per as PCP - General (General Practice)  This Provider for this visit: Treatment Team:  Attending Provider: Brand Males, MD    08/03/2022 -   Chief Complaint  Patient presents with   Follow-up    Cough is unchanged since last visit.    Follow-up chronic cough and throat clearing  HPI Jorge Finley 66 y.o. -returns for follow-up.  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  Work-up is not contributory so far.  He saw ENT he is on Prilosec and Pepcid but still no relief.  His symptoms continue unabated.  He does clear the throat a lot.  His RSI cough score is listed below.  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  He says early in the morning he is fine when he goes for a run in the morning is fine but by afternoon and in the evening the cough  gets worse.  His clearing of the throat also gets worse by end of the day.  However even though he does not sleep well generally it the symptoms do not bother him at night.    Dr Lorenza Cambridge Reflux Symptom Index (> 13-15 suggestive of LPR cough) 08/03/2022    Hoarseness of problem with voice 0  Clearing  Of Throat 4  Excess throat mucus or feeling of post nasal drip 3  Difficulty swallowing food, liquid or tablets 1  Cough after eating or lying down 0  Breathing difficulties or choking episodes 0  Troublesome or annoying cough 0  Sensation of something sticking in throat or lump in throat 2  Heartburn, chest pain, indigestion, or stomach acid coming up 0  TOTAL 10    CT Chest data  No results found.  PFT     Latest Ref Rng & Units 05/03/2022   11:01 AM  PFT Results  FVC-Pre L 4.24   FVC-Predicted Pre % 92   FVC-Post L 4.14   FVC-Predicted Post % 90   Pre FEV1/FVC % % 78   Post FEV1/FCV % % 84   FEV1-Pre L 3.29   FEV1-Predicted Pre % 96   FEV1-Post L 3.47   DLCO uncorrected ml/min/mmHg 25.03   DLCO UNC% % 94   DLCO corrected ml/min/mmHg 25.03   DLCO COR %Predicted % 94   DLVA Predicted % 100   TLC L 6.80   TLC % Predicted % 96   RV % Predicted % 90        has a past medical history of Basal cell carcinoma ([id number]).   reports that he has never smoked. He has never used smokeless tobacco.  Past Surgical History:  Procedure Laterality Date   FRACTURE SURGERY Left    leg -childhood   HERNIA REPAIR Bilateral    8 years ago    No Known Allergies  Immunization History  Administered Date(s) Administered   Influenza, High Dose Seasonal PF 01/19/2022   PFIZER(Purple Top)SARS-COV-2 Vaccination 01/03/2020, 01/25/2020, 09/29/2020   Pneumococcal Polysaccharide-23 02/18/2021   Tdap 04/29/2019    Family History  Problem Relation Age of Onset   Healthy Mother    Cancer Father        prostate   Cancer Sister    Healthy Brother    Healthy Brother    Asthma  Daughter      Current Outpatient Medications:    albuterol (VENTOLIN HFA) 108 (90 Base) MCG/ACT inhaler, SMARTSIG:1.5 Inhalation Via Inhaler Every 4-6 Hours PRN, Disp: , Rfl:    ASPIRIN 81 PO, Take 81 mg by mouth daily., Disp: , Rfl:    famotidine (PEPCID) 20 MG tablet, Take 20 mg by mouth daily., Disp: , Rfl:    fexofenadine (ALLEGRA) 180 MG tablet, Take 180 mg by mouth daily., Disp: , Rfl:    guaifenesin (HUMIBID E) 400 MG TABS tablet, Take 400 mg by mouth every 6 (six) hours as needed., Disp: , Rfl:    omeprazole (PRILOSEC) 40 MG capsule, Take 40 mg by mouth daily., Disp: , Rfl:    rosuvastatin (CRESTOR) 20 MG tablet, Take 20 mg by mouth daily., Disp: , Rfl:       Objective:   Vitals:   08/03/22 1547  BP: 132/74  Pulse: 67  Temp: 98 F (36.7 C)  TempSrc: Oral  SpO2: 97%  Weight: 150 lb (68 kg)  Height: '5\' 10"'$  (1.778 m)    Estimated body mass index is 21.52 kg/m as calculated from the following:   Height as of this encounter: '5\' 10"'$  (1.778 m).   Weight as of this encounter: 150 lb (68 kg).  '@WEIGHTCHANGE'$ @  Filed Weights   08/03/22 1547  Weight: 150 lb (68 kg)     Physical Exam   General: No distress. Looks well Neuro: Alert and Oriented x 3. GCS 15. Speech normal Psych: Pleasant Resp:  Barrel Chest - no.  Wheeze - no, Crackles - no, No overt respiratory distress CVS: Normal heart sounds. Murmurs - no Ext: Stigmata of Connective Tissue Disease - no HEENT: Normal upper airway. PEERL +. No post nasal drip. CLEARS THROAT INTERMITTENTLY        Assessment:       ICD-10-CM   1. Chronic cough  R05.3     2. Irritable larynx  J38.7  3. Chronic throat clearing  R09.89          Plan:     Patient Instructions     ICD-10-CM   1. Chronic cough  R05.3     2. Irritable larynx  J38.7     3. 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 If this makes you too sleepy or drowsy call us and we will cut your medication dosing down -Suck on sugarless lozenge as needed - Any urge to cough to clear your throat drink water - Cotninue  Delsym 2 tsp Twice daily  for cough/throat clearing As needed   - Continue Tessalon Three times a day  for cough /throat clearing as needed  -Continue Allegra Prilosec and Pepcid as before  Follow-up - 4 to 6 weeks with Dr. Chase Caller 15-minute visit [video visit or face-to-face as desired]  -Can see Tammy Parrott if my schedule is full  -RSI cough score at follow-up    SIGNATURE    Dr. Brand Males, M.D., F.C.C.P,  Pulmonary and Critical Care Medicine Staff Physician, [place] Director - Interstitial Lung Disease  Program  Pulmonary [place], [place], [address]  Pager: [phone], If no answer or between  15:00h - 7:00h: call [phone] Telephone: [phone]  4:21 PM 08/03/2022

## 2022-08-31 ENCOUNTER — Encounter: Payer: Self-pay | Admitting: Adult Health

## 2022-08-31 ENCOUNTER — Telehealth (INDEPENDENT_AMBULATORY_CARE_PROVIDER_SITE_OTHER): Payer: HMO | Admitting: Adult Health

## 2022-08-31 DIAGNOSIS — R053 Chronic cough: Secondary | ICD-10-CM

## 2022-08-31 DIAGNOSIS — J387 Other diseases of larynx: Secondary | ICD-10-CM

## 2022-08-31 MED ORDER — BENZONATATE 200 MG PO CAPS: 200.0000 mg | ORAL_CAPSULE | Freq: Three times a day (TID) | ORAL | 1 refills | Status: AC | PRN

## 2022-08-31 MED ORDER — BUDESONIDE-FORMOTEROL FUMARATE 80-4.5 MCG/ACT IN AERO: 2.0000 | INHALATION_SPRAY | Freq: Two times a day (BID) | RESPIRATORY_TRACT | 6 refills | Status: AC

## 2022-08-31 NOTE — Progress Notes (Signed)
Virtual Visit via Video Note  I connected with Jorge Finley on 08/31/22 at  2:30 PM EDT by a video enabled telemedicine application and verified that I am speaking with the correct person using two identifiers.  Location: Patient: Home  Provider: Office    I discussed the limitations of evaluation and management by telemedicine and the availability of in person appointments. The patient expressed understanding and agreed to proceed.  History of Present Illness: 66 year old male never smoker seen for pulmonary consult Mar 16, 2022 for chronic cough x2 years  Today's video visit is a 1 month follow-up.  Patient was seen last visit for ongoing chronic cough has been present for greater than 2 years.  Work-up has been unrevealing.  High-resolution CT chest was negative for ILD.  PFTs were normal.  Patient is treated for an upper airway cough syndrome with Delsym, Tessalon Perles, Allegra, chlor tabs, Prilosec and Pepcid last visit he was started on gabapentin and referred to the voice rehab center .  Patient says he was unable to tolerate gabapentin. Was able to take only at night but that caused too much sedation. Took for 2 weeks. Patient says his cough is no better.  Continues to have Currently taking prilosec and pepcid. Takes allegra daily. Did not take Chlor tab , delsym or tessalon.  Has upcoming appointment with voice rehab.  Coughing is usually productive with white mucus. Minimal albuterol inhaler . Cough is worse with exercise.   Past Medical History:  Diagnosis Date   Basal cell carcinoma [id number]   left posterior leg   Current Outpatient Medications on File Prior to Visit  Medication Sig Dispense Refill   albuterol (VENTOLIN HFA) 108 (90 Base) MCG/ACT inhaler SMARTSIG:1.5 Inhalation Via Inhaler Every 4-6 Hours PRN     ASPIRIN 81 PO Take 81 mg by mouth daily.     famotidine (PEPCID) 20 MG tablet Take 20 mg by mouth daily.     fexofenadine (ALLEGRA) 180 MG tablet Take 180 mg by  mouth daily.     guaifenesin (HUMIBID E) 400 MG TABS tablet Take 400 mg by mouth every 6 (six) hours as needed.     omeprazole (PRILOSEC) 40 MG capsule Take 40 mg by mouth daily.     rosuvastatin (CRESTOR) 20 MG tablet Take 20 mg by mouth daily.     gabapentin (NEURONTIN) 300 MG capsule Take 1 capsule (300 mg total) by mouth 3 (three) times daily. (Patient not taking: Reported on 08/31/2022) 90 capsule 1   No current facility-administered medications on file prior to visit.     Observations/Objective:  05/2022 PFTs  that show normal lung function with no airflow obstruction or restriction and a normal diffusing capacity.  FEV1 was 102%, ratio 84, FVC 90%, no significant bronchodilator response, DLCO 94%   High-resolution CT chest was done on Mar 30, 2022 that showed no evidence of interstitial lung disease or acute process.  Mild bilateral air trapping noted.  Small bilateral solid pulmonary nodules measuring up to 4 mm.  Assessment and Plan: Chronic cough/UACS -work-up high-resolution CT chest negative for interstitial lung disease.  Pulmonary function testing normal..  Continue to treat for trigger prevention cough control regimen.  Voice rehab as planned  ?RAD  vs cough variant asthma -Trial of Symbicort . Albuterol inhaler As needed    Plan  Patient Instructions  Trial Symbicort 80 2 puffs Twice daily  , rinse after use.  Delsym 2 tsp Twice daily  for cough/throat clearing As needed  Tessalon Three times a day  for cough /throat clearing as needed  Allegra daily  Chlorpheniramine '4mg'$  At bedtime for throat clearing As needed   Continue on Prilosec and Pepcid  GERD diet  NO Mints.  Use sips of water to soothe throat and avoid throat clearing  Follow up with Dr. Chase Caller in 2-3 months w up if symptoms do not improve or worsen or seek emergency care      Follow Up Instructions:  I discussed the assessment and treatment plan with the patient. The patient was provided an  opportunity to ask questions and all were answered. The patient agreed with the plan and demonstrated an understanding of the instructions.   The patient was advised to call back or seek an in-person evaluation if the symptoms worsen or if the condition fails to improve as anticipated.  I provided 30 minutes of non-face-to-face time during this encounter.   Rexene Edison, NP;'

## 2022-08-31 NOTE — Patient Instructions (Addendum)
Trial Symbicort 80 2 puffs Twice daily  , rinse after use.  Delsym 2 tsp Twice daily  for cough/throat clearing As needed   Tessalon Three times a day  for cough /throat clearing as needed  Allegra daily  Chlorpheniramine '4mg'$  At bedtime for throat clearing As needed   Continue on Prilosec and Pepcid  GERD diet  NO Mints.  Use sips of water to soothe throat and avoid throat clearing  Follow up with Dr. Chase Caller in 2-3 months w up if symptoms do not improve or worsen or seek emergency care

## 2022-09-01 ENCOUNTER — Other Ambulatory Visit (HOSPITAL_COMMUNITY): Payer: Self-pay

## 2022-09-02 ENCOUNTER — Ambulatory Visit: Payer: PPO | Attending: Internal Medicine

## 2022-09-02 DIAGNOSIS — R498 Other voice and resonance disorders: Secondary | ICD-10-CM | POA: Diagnosis present

## 2022-09-03 ENCOUNTER — Other Ambulatory Visit: Payer: Self-pay

## 2022-09-03 NOTE — Therapy (Signed)
OUTPATIENT SPEECH LANGUAGE PATHOLOGY VOICE EVALUATION   Patient Name: Jorge Finley MRN: [id number] DOB:03/20/1956, 66 y.o., male Today's Date: 09/03/2022  PCP: No PCP on record REFERRING PROVIDER: Brand Males, MD    End of Session - 09/03/22 1154     Visit Number 1    Number of Visits 17    Date for SLP Re-Evaluation 11/09/22    SLP Start Time 1320    SLP Stop Time  1400    SLP Time Calculation (min) 40 min    Activity Tolerance Patient tolerated treatment well             Past Medical History:  Diagnosis Date   Basal cell carcinoma [id number]   left posterior leg   Past Surgical History:  Procedure Laterality Date   FRACTURE SURGERY Left    leg -childhood   HERNIA REPAIR Bilateral    8 years ago   Patient Active Problem List   Diagnosis Date Noted   Chronic cough 05/09/2022   Lung nodules 05/09/2022    Onset date: >1 year ago  REFERRING DIAG: R05.3 (ICD-10-CM) - Chronic cough J38.7 (ICD-10-CM) - Irritable larynx R09.89 (ICD-10-CM) - Chronic throat clearing   THERAPY DIAG:  Other voice and resonance disorders  Rationale for Evaluation and Treatment: Rehabilitation  SUBJECTIVE:   SUBJECTIVE STATEMENT: "It's really bad when my heart rate increases." Pt accompanied by: self  PERTINENT HISTORY: lt leg fracture, hernia repair 8 years ago. Pt avid runner and cyclist.  PAIN:  Are you having pain? No  FALLS: Has patient fallen in last 6 months? No,   LIVING ENVIRONMENT: Lives with: lives with their spouse Lives in: House/apartment  PLOF: Independent  PATIENT GOALS: Decr throat clearing  OBJECTIVE:   DIAGNOSTIC FINDINGS:   Dr Lorenza Cambridge Reflux Symptom Index (> 13-15 suggestive of LPR cough) 08/03/2022      Hoarseness of problem with voice 0  Clearing  Of Throat 4  Excess throat mucus or feeling of post nasal drip 3  Difficulty swallowing food, liquid or tablets 1  Cough after eating or lying down 0  Breathing difficulties or choking  episodes 0  Troublesome or annoying cough 0  Sensation of something sticking in throat or lump in throat 2  Heartburn, chest pain, indigestion, or stomach acid coming up 0  TOTAL 10    COGNITION: Overall cognitive status: Within functional limits for tasks assessed  SOCIAL HISTORY: Occupation: Retired Building control surveyor intake: optimal Caffeine/alcohol intake: minimal Daily voice use: minimal  PERCEPTUAL VOICE ASSESSMENT: Voice quality: normal Vocal abuse: habitual throat clearing 17 times in 32 minutes Resonance: normal Respiratory function: thoracic breathing and clavicular breathing  OBJECTIVE VOICE ASSESSMENT: Conversational pitch average: 115 Hz Conversational pitch range: 85-201 Hz Conversational loudness average: 71 dB Conversational loudness range: 63-78 dB  PATIENT REPORTED OUTCOME MEASURES (PROM): V-RQOL: provided next session  TODAY'S TREATMENT:  DATE: 09-02-22: SLP educated pt about abdominal breathing (AB) and assessed pt's success with this - he obtained success at rest in approx 5 minutes so SLP told pt to practice this 15 minutes BID.   PATIENT EDUCATION: Education details: see "today's treatment" Person educated: Patient Education method: Explanation, Demonstration, and Verbal cues Education comprehension: verbalized understanding, returned demonstration, and verbal cues required  HOME EXERCISE PROGRAM: AB BID for 15 minutes each  GOALS: Goals reviewed with patient? Yes  SHORT TERM GOALS: Target date: 10/08/2022    Pt will use AB with 18/20 sentence responses in 2 sessions Baseline: Goal status: INITIAL  2.  Pt will report 25% reduction in VCD sx Baseline:  Goal status: INITIAL  3.  Pt will report successful use of relaxation strategies PRN x 3 Baseline:  Goal status: INITIAL   LONG TERM GOALS: Target date: 11/09/2022     Pt will report reduction in VCD sx by 75% in 2 sessions Baseline:  Goal status: INITIAL  2.  Pt will engage in 10 minutes conversation with SLP and demo AB 75% of the time Baseline:  Goal status: INITIAL   ASSESSMENT:  CLINICAL IMPRESSION: Patient is a 66 y.o. 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 Pt cleared throat 17 times in 32 minutes. When thinking about and performing AB, pt had two throat clears in 8 minutes. This is a significant difference and indicates pt will very likely benefit from skilled ST targeting cough suppression tx and AB.   OBJECTIVE IMPAIRMENTS: include voice disorder. These impairments are limiting patient from effectively communicating at home and in community. Factors affecting potential to achieve goals and functional outcome are  none .Marland Kitchen Patient will benefit from skilled SLP services to address above impairments and improve overall function.  REHAB POTENTIAL: Excellent  PLAN:  SLP FREQUENCY: 2x/week  SLP DURATION: 8 weeks  PLANNED INTERVENTIONS: Environmental controls, Cueing hierachy, Internal/external aids, Functional tasks, SLP instruction and feedback, Compensatory strategies, and Patient/family education    [facility], [place] 09/03/2022, 11:55 AM

## 2022-09-03 NOTE — Patient Instructions (Signed)
    Practice abdominal breathing for 15 minutes, twice each day.

## 2022-09-09 ENCOUNTER — Ambulatory Visit: Payer: PPO

## 2022-09-09 NOTE — Therapy (Signed)
[facility] [address] [place], [place], [address] Phone: [phone]   Fax:  [phone]  Patient Details  Name: Jorge Finley MRN: [id number] Date of Birth: [birth year] Referring Provider:  Dr. Ann Lions  Encounter Date: 09/09/2022   ST - No Show Pt no-showed his scheduled appointment at [place] today. He will be called to reschedule this appointment.    [place], [place] 09/09/2022, 10:24 AM  [facility] [address], [place], [place], [address] Phone: [phone]   Fax:  [phone]

## 2022-09-29 ENCOUNTER — Ambulatory Visit: Payer: PPO

## 2022-09-29 DIAGNOSIS — R498 Other voice and resonance disorders: Secondary | ICD-10-CM | POA: Diagnosis not present

## 2022-09-29 NOTE — Therapy (Signed)
OUTPATIENT SPEECH LANGUAGE PATHOLOGY VOICE EVALUATION   Patient Name: Jorge Finley MRN: [id number] DOB:April 08, 1956, 66 y.o., male Today's Date: 09/29/2022  PCP: No PCP on record REFERRING PROVIDER: Brand Males, MD    End of Session - 09/29/22 1622     Visit Number 2    Number of Visits 17    Date for SLP Re-Evaluation 11/09/22    SLP Start Time 1536    SLP Stop Time  59    SLP Time Calculation (min) 39 min    Activity Tolerance Patient tolerated treatment well              Past Medical History:  Diagnosis Date   Basal cell carcinoma [id number]   left posterior leg   Past Surgical History:  Procedure Laterality Date   FRACTURE SURGERY Left    leg -childhood   HERNIA REPAIR Bilateral    8 years ago   Patient Active Problem List   Diagnosis Date Noted   Chronic cough 05/09/2022   Lung nodules 05/09/2022    Onset date: >1 year ago  REFERRING DIAG: R05.3 (ICD-10-CM) - Chronic cough J38.7 (ICD-10-CM) - Irritable larynx R09.89 (ICD-10-CM) - Chronic throat clearing   THERAPY DIAG:  Other voice and resonance disorders  Rationale for Evaluation and Treatment: Rehabilitation  SUBJECTIVE:   SUBJECTIVE STATEMENT: "It's amazing - (the abdominal breathing - AB) helps" Pt accompanied by: self  PERTINENT HISTORY: lt leg fracture, hernia repair 8 years ago. Pt avid runner and cyclist.  PAIN:  Are you having pain? No  PATIENT GOALS: Decr throat clearing  OBJECTIVE:  TODAY'S TREATMENT:                                                                                                                                         DATE:  09/29/22: Pt reports he performed AB "all the time" in the first week. He finds VCD sx have been reduced 15-20%. 7 throat clears in this session of 40 minutes today. SLP led pt practice with sentence responses using AB. Pt with 95%+ success and homework to practice 15 minutes with sentence responses.   09-02-22: SLP educated pt about  abdominal breathing (AB) and assessed pt's success with this - he obtained success at rest in approx 5 minutes so SLP told pt to practice this 15 minutes BID.   DIAGNOSTIC FINDINGS:   Dr Lorenza Cambridge Reflux Symptom Index (> 13-15 suggestive of LPR cough) 08/03/2022      Hoarseness of problem with voice 0  Clearing  Of Throat 4  Excess throat mucus or feeling of post nasal drip 3  Difficulty swallowing food, liquid or tablets 1  Cough after eating or lying down 0  Breathing difficulties or choking episodes 0  Troublesome or annoying cough 0  Sensation of something sticking in throat or lump in throat 2  Heartburn, chest pain, indigestion, or stomach acid coming  up 0  TOTAL 10    PATIENT REPORTED OUTCOME MEASURES (PROM): V-RQOL: provided next session    PATIENT EDUCATION: Education details: see "today's treatment" Person educated: Patient Education method: Explanation, Demonstration, and Verbal cues Education comprehension: verbalized understanding, returned demonstration, and verbal cues required  HOME EXERCISE PROGRAM: See "today's treatment" for details.  GOALS: Goals reviewed with patient? Yes  SHORT TERM GOALS: Target date: 10/08/2022    Pt will use AB with 18/20 sentence responses in 2 sessions Baseline:09/29/22 Goal status: Ongoing  2.  Pt will report 25% reduction in VCD sx Baseline:  Goal status: Ongoing  3.  Pt will report successful use of relaxation strategies PRN x 3 Baseline:  Goal status: Ongoing   LONG TERM GOALS: Target date: 11/09/2022    Pt will report reduction in VCD sx by 75% in 2 sessions Baseline:  Goal status: Ongoing  2.  Pt will engage in 10 minutes conversation with SLP and demo AB 75% of the time Baseline:  Goal status: Ongoing   ASSESSMENT:  CLINICAL IMPRESSION: Patient is a 66 y.o. 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 Pt cleared throat 7 times in ~40 minutes when working  with AB, when not, he had 4 throat clears in 4 minutes. This difference is again significant and indicates pt will cont to benefit from skilled ST targeting cough suppression tx and AB.   OBJECTIVE IMPAIRMENTS: include voice disorder. These impairments are limiting patient from effectively communicating at home and in community. Factors affecting potential to achieve goals and functional outcome are  none .Marland Kitchen Patient will benefit from skilled SLP services to address above impairments and improve overall function.  REHAB POTENTIAL: Excellent  PLAN:  SLP FREQUENCY: 2x/week  SLP DURATION: 8 weeks  PLANNED INTERVENTIONS: Environmental controls, Cueing hierachy, Internal/external aids, Functional tasks, SLP instruction and feedback, Compensatory strategies, and Patient/family education    [facility], [place] 09/29/2022, 4:23 PM

## 2022-10-01 ENCOUNTER — Ambulatory Visit: Payer: PPO | Attending: Internal Medicine

## 2022-10-01 DIAGNOSIS — R498 Other voice and resonance disorders: Secondary | ICD-10-CM | POA: Diagnosis present

## 2022-10-01 NOTE — Therapy (Signed)
OUTPATIENT SPEECH LANGUAGE PATHOLOGY TREATMENT   Patient Name: Jorge Finley MRN: [id number] DOB:[birth year], 66 y.o., male Today's Date: 10/01/2022  PCP: No PCP on record REFERRING PROVIDER: Brand Males, MD    End of Session - 10/01/22 1307     Visit Number 3    Number of Visits 17    Date for SLP Re-Evaluation 11/09/22    SLP Start Time 1103    SLP Stop Time  1145    SLP Time Calculation (min) 42 min    Activity Tolerance Patient tolerated treatment well               Past Medical History:  Diagnosis Date   Basal cell carcinoma [id number]   left posterior leg   Past Surgical History:  Procedure Laterality Date   FRACTURE SURGERY Left    leg -childhood   HERNIA REPAIR Bilateral    8 years ago   Patient Active Problem List   Diagnosis Date Noted   Chronic cough 05/09/2022   Lung nodules 05/09/2022    Onset date: >1 year ago  REFERRING DIAG: R05.3 (ICD-10-CM) - Chronic cough J38.7 (ICD-10-CM) - Irritable larynx R09.89 (ICD-10-CM) - Chronic throat clearing   THERAPY DIAG:  Other voice and resonance disorders  Rationale for Evaluation and Treatment: Rehabilitation  SUBJECTIVE:   SUBJECTIVE STATEMENT: "It still takes a conscious effort to do (abdominal breathing (AB).)" Pt accompanied by: self  PERTINENT HISTORY: lt leg fracture, hernia repair 8 years ago. Pt avid runner and cyclist.  PAIN:  Are you having pain? No  PATIENT GOALS: Decr throat clearing  OBJECTIVE:  TODAY'S TREATMENT:                                                                                                                                         DATE:  10/01/22: Pt warmed up with reading sentences with AB, with faster inhalation time than previous session. In sentence and multi-sentnece responses pt achieved AB 95%+ success. Up to this point, pt had 8 throat clears. In very short conversational segments pt maintained AB 85-90% success with shorter inhalation times than previous  session. SLP discussed/educated pt about external cues for AB and provided some examples. Pt decided to wear watch on opposite hand.  09/29/22: Pt reports he performed AB "all the time" in the first week. He finds VCD sx have been reduced 15-20%. 7 throat clears in this session of 40 minutes today. SLP led pt practice with sentence responses using AB. Pt with 95%+ success and homework to practice 15 minutes with sentence responses.   09-02-22: SLP educated pt about abdominal breathing (AB) and assessed pt's success with this - he obtained success at rest in approx 5 minutes so SLP told pt to practice this 15 minutes BID.   DIAGNOSTIC FINDINGS:   Dr Lorenza Cambridge Reflux Symptom Index (> 13-15 suggestive of LPR cough) 08/03/2022  Hoarseness of problem with voice 0  Clearing  Of Throat 4  Excess throat mucus or feeling of post nasal drip 3  Difficulty swallowing food, liquid or tablets 1  Cough after eating or lying down 0  Breathing difficulties or choking episodes 0  Troublesome or annoying cough 0  Sensation of something sticking in throat or lump in throat 2  Heartburn, chest pain, indigestion, or stomach acid coming up 0  TOTAL 10    PATIENT REPORTED OUTCOME MEASURES (PROM): V-RQOL: provided next session  PATIENT EDUCATION: Education details: see "today's treatment" Person educated: Patient Education method: Explanation, Demonstration, and Verbal cues Education comprehension: verbalized understanding, returned demonstration, and verbal cues required  HOME EXERCISE PROGRAM: See "today's treatment" for details.  GOALS: Goals reviewed with patient? Yes  SHORT TERM GOALS: Target date: 10/08/2022    Pt will use AB with 18/20 sentence responses in 2 sessions Baseline:09/29/22 Goal status: Met  2.  Pt will report 25% reduction in VCD sx Baseline:  Goal status: Ongoing  3.  Pt will report successful use of relaxation strategies PRN x 3 Baseline:  Goal status:  Ongoing   LONG TERM GOALS: Target date: 11/09/2022    Pt will report reduction in VCD sx by 75% in 2 sessions Baseline:  Goal status: Ongoing  2.  Pt will engage in 10 minutes conversation with SLP and demo AB 75% of the time Baseline:  Goal status: Ongoing   ASSESSMENT:  CLINICAL IMPRESSION: Patient is a 66 y.o. 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 Pt cleared throat 8 times in ~40 minutes between stimuli working on AB. When speaking in conversational segments with AB pt did not clear throat. This difference is again significant and indicates pt will cont to benefit from skilled ST targeting cough suppression tx and AB.   OBJECTIVE IMPAIRMENTS: include voice disorder. These impairments are limiting patient from effectively communicating at home and in community. Factors affecting potential to achieve goals and functional outcome are  none .Marland Kitchen Patient will benefit from skilled SLP services to address above impairments and improve overall function.  REHAB POTENTIAL: Excellent  PLAN:  SLP FREQUENCY: 2x/week  SLP DURATION: 8 weeks  PLANNED INTERVENTIONS: Environmental controls, Cueing hierachy, Internal/external aids, Functional tasks, SLP instruction and feedback, Compensatory strategies, and Patient/family education    [facility], [place] 10/01/2022, 1:08 PM

## 2022-10-05 ENCOUNTER — Ambulatory Visit: Payer: PPO

## 2022-10-12 ENCOUNTER — Ambulatory Visit: Payer: PPO

## 2022-10-12 DIAGNOSIS — R498 Other voice and resonance disorders: Secondary | ICD-10-CM | POA: Diagnosis not present

## 2022-10-12 NOTE — Therapy (Signed)
OUTPATIENT SPEECH LANGUAGE PATHOLOGY TREATMENT   Patient Name: Jorge Finley MRN: [id number] DOB:1956/06/19, 66 y.o., male Today's Date: 10/12/2022  PCP: No PCP on record REFERRING PROVIDER: Brand Males, MD        Past Medical History:  Diagnosis Date   Basal cell carcinoma [id number]   left posterior leg   Past Surgical History:  Procedure Laterality Date   FRACTURE SURGERY Left    leg -childhood   HERNIA REPAIR Bilateral    8 years ago   Patient Active Problem List   Diagnosis Date Noted   Chronic cough 05/09/2022   Lung nodules 05/09/2022   SPEECH THERAPY DISCHARGE SUMMARY  Visits from Start of Care: 4  Current functional level related to goals / functional outcomes: See below.   Remaining deficits: Minimal sx of VCD.    Education / Equipment: Relaxation strategies, Abdominal breathing, basics of VCD  Patient agrees to discharge. Patient goals were partially met. Patient is being discharged due to being pleased with the current functional level..     Onset date: >1 year ago  REFERRING DIAG: R05.3 (ICD-10-CM) - Chronic cough J38.7 (ICD-10-CM) - Irritable larynx R09.89 (ICD-10-CM) - Chronic throat clearing   THERAPY DIAG:  Other voice and resonance disorders  Rationale for Evaluation and Treatment: Rehabilitation  SUBJECTIVE:   SUBJECTIVE STATEMENT: "(Symptoms) have reduced by at least 50%, probably more." Pt accompanied by: self  PERTINENT HISTORY: lt leg fracture, hernia repair 8 years ago. Pt avid runner and cyclist.  PAIN:  Are you having pain? No  PATIENT GOALS: Decr throat clearing  OBJECTIVE:  TODAY'S TREATMENT:                                                                                                                                         DATE:  10/12/22: SLP and pt spoke in conversation for 25 minutes with pt clearing throat x3, and AB use of >75%. He stated he has had good success with AB while exercising and with  conversation. He is satisfied with progress at this time and voiced thankfulness to SLP for this therapy.  10/01/22: Pt warmed up with reading sentences with AB, with faster inhalation time than previous session. In sentence and multi-sentnece responses pt achieved AB 95%+ success. Up to this point, pt had 8 throat clears. In very short conversational segments pt maintained AB 85-90% success with shorter inhalation times than previous session. SLP discussed/educated pt about external cues for AB and provided some examples. Pt decided to wear watch on opposite hand.  09/29/22: Pt reports he performed AB "all the time" in the first week. He finds VCD sx have been reduced 15-20%. 7 throat clears in this session of 40 minutes today. SLP led pt practice with sentence responses using AB. Pt with 95%+ success and homework to practice 15 minutes with sentence responses.   09-02-22: SLP educated pt about abdominal breathing (  AB) and assessed pt's success with this - he obtained success at rest in approx 5 minutes so SLP told pt to practice this 15 minutes BID.   DIAGNOSTIC FINDINGS:   Dr Lorenza Cambridge Reflux Symptom Index (> 13-15 suggestive of LPR cough) 08/03/2022      Hoarseness of problem with voice 0  Clearing  Of Throat 4  Excess throat mucus or feeling of post nasal drip 3  Difficulty swallowing food, liquid or tablets 1  Cough after eating or lying down 0  Breathing difficulties or choking episodes 0  Troublesome or annoying cough 0  Sensation of something sticking in throat or lump in throat 2  Heartburn, chest pain, indigestion, or stomach acid coming up 0  TOTAL 10    PATIENT REPORTED OUTCOME MEASURES (PROM): V-RQOL: not provided. Pt indicates 50-75% reduction in sx.  PATIENT EDUCATION: Education details: see "today's treatment" Person educated: Patient Education method: Explanation, Demonstration, and Verbal cues Education comprehension: verbalized understanding, returned demonstration,  and verbal cues required  HOME EXERCISE PROGRAM: See "today's treatment" for details.  GOALS: Goals reviewed with patient? Yes  SHORT TERM GOALS: Target date: 10/08/2022    Pt will use AB with 18/20 sentence responses in 2 sessions Baseline:09/29/22 Goal status: Met  2.  Pt will report 25% reduction in VCD sx Baseline:  Goal status: Met  3.  Pt will report successful use of relaxation strategies PRN x 3 Baseline:  Goal status: Deferred - pt uses AB for relaxation successfully  LONG TERM GOALS: Target date: 11/09/2022    Pt will report reduction in VCD sx by 75% in 2 sessions Baseline:  Goal status: Partially met (50-75% reduction)  2.  Pt will engage in 10 minutes conversation with SLP and demo AB 75% of the time Baseline:  Goal status: Met   ASSESSMENT:  CLINICAL IMPRESSION: Patient is a 66 y.o. 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 Pt cleared throat 3 times in ~35 minutes. When speaking in conversational segments with AB pt did not clear throat. Pt has had decr of VCD sx between 50-75% and is satisfied with progress at this time.   OBJECTIVE IMPAIRMENTS: include voice disorder. These impairments are limiting patient from effectively communicating at home and in community. Factors affecting potential to achieve goals and functional outcome are  none .Marland Kitchen Patient will benefit from skilled SLP services to address above impairments and improve overall function.  REHAB POTENTIAL: Excellent  PLAN:  Discharge at this time. PLANNED INTERVENTIONS: Environmental controls, Cueing hierachy, Internal/external aids, Functional tasks, SLP instruction and feedback, Compensatory strategies, and Patient/family education    [facility], [place] 10/12/2022, 2:57 PM

## 2022-10-18 ENCOUNTER — Ambulatory Visit: Payer: PPO

## 2022-11-03 ENCOUNTER — Ambulatory Visit: Payer: PPO

## 2022-11-10 ENCOUNTER — Encounter: Payer: Self-pay | Admitting: Internal Medicine

## 2022-11-10 ENCOUNTER — Ambulatory Visit: Payer: PPO | Attending: Internal Medicine | Admitting: Internal Medicine

## 2022-11-10 VITALS — BP 126/79 | HR 68 | Ht 70.0 in | Wt 152.0 lb

## 2022-11-10 DIAGNOSIS — E785 Hyperlipidemia, unspecified: Secondary | ICD-10-CM

## 2022-11-10 DIAGNOSIS — R931 Abnormal findings on diagnostic imaging of heart and coronary circulation: Secondary | ICD-10-CM | POA: Diagnosis not present

## 2022-11-10 DIAGNOSIS — I2510 Atherosclerotic heart disease of native coronary artery without angina pectoris: Secondary | ICD-10-CM

## 2022-11-10 LAB — NMR, LIPOPROFILE
Cholesterol, Total: 154 mg/dL (ref 100–199)
HDL Particle Number: 50.3 umol/L (ref >=30.5)
HDL-C: 87 mg/dL (ref >39)
LDL Particle Number: 539 nmol/L (ref <1000)
LDL Size: 20.1 nm — ABNORMAL LOW (ref >20.5)
LDL-C (NIH Calc): 55 mg/dL (ref 0–99)
LP-IR Score: 35 (ref <=45)
Small LDL Particle Number: 282 nmol/L (ref <=527)
Triglycerides: 56 mg/dL (ref 0–149)

## 2022-11-10 NOTE — Progress Notes (Signed)
LIPID CLINIC CONSULT NOTE  Chief Complaint:  Manage dyslipidemia  Primary Care Physician: Patient, No Pcp Per  Primary Cardiologist:  None  HPI:  Jorge Finley is a 67 y.o. male who is being seen today for the evaluation of dyslipidemia at the request of No ref. provider found. this is a pleasant 67 year old male with little past medical history.  He reports a fairly active and healthy lifestyle.  He says he is try to reduce saturated fats in his diet more recently.  He has exercised and competitively training for a number of years.  He does note that his father had a stroke in his 36s and then another stroke late in his 21s.  More recently he was having issues with breathing during exercise, particularly running.  It was thought that he might be having reflux symptoms.  He was placed on medications for this and had a CT scan of the lungs which showed coronary artery calcification.  Subsequently was sent for dedicated CT scan that showed a very high calcium score of 694, 86 percentile for age and sex matched controls.  He had coronary calcium of all 3 coronaries including the left main.  He then had a lipid NMR which actually showed surprisingly reasonable untreated cholesterol.  Total of 979 for LDL particle number, total cholesterol 168, triglycerides 63, HDL 65 and LDL 91.  His small LDL particle number was 308.  Subsequently was placed per guidelines on high intensity statin therapy, rosuvastatin 20 mg daily which she is taken for about 2-1/2 weeks.  He is also taking low-dose aspirin.  He is asymptomatic with exercise other than he does get this scratchiness/mild tightness in his throat with exertion but also without exertion.  Its not clear that this is angina is not limiting to his exercise.  11/10/2022  Jorge Finley returns today for follow-up.  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 He had testing for LP(a) which was negative at 33.7 nmol/L.  He reports no issues with rosuvastatin 20 mg daily.  He had labs drawn to this past Monday but they have not yet resulted.  PMHx:  Past Medical History:  Diagnosis Date   Basal cell carcinoma [id number]   left posterior leg    Past Surgical History:  Procedure Laterality Date   FRACTURE SURGERY Left    leg -childhood   HERNIA REPAIR Bilateral    8 years ago    FAMHx:  Family History  Problem Relation Age of Onset   Healthy Mother    Cancer Father        prostate   Cancer Sister    Healthy Brother    Healthy Brother    Asthma Daughter     SOCHx:   reports that he has never smoked. He has never used smokeless tobacco. He reports current alcohol use of about 14.0 standard drinks of alcohol per week. He reports that he does not use drugs.  ALLERGIES:  No Known Allergies  ROS: Pertinent items noted in HPI and remainder of comprehensive ROS otherwise negative.  HOME MEDS: Current Outpatient Medications on File Prior to Visit  Medication Sig Dispense Refill   albuterol (VENTOLIN HFA) 108 (90 Base) MCG/ACT inhaler SMARTSIG:1.5 Inhalation Via Inhaler Every 4-6 Hours PRN     ASPIRIN 81 PO Take 81 mg by mouth daily.  benzonatate (TESSALON) 200 MG capsule Take 1 capsule (200 mg total) by mouth 3 (three) times daily as needed for cough. 45 capsule 1   budesonide-formoterol (SYMBICORT) 80-4.5 MCG/ACT inhaler Inhale 2 puffs into the lungs 2 (two) times daily. 1 each 6   famotidine (PEPCID) 20 MG tablet Take 20 mg by mouth daily.     fexofenadine (ALLEGRA) 180 MG tablet Take 180 mg by mouth daily.     guaifenesin (HUMIBID E) 400 MG TABS tablet Take 400 mg by mouth every 6 (six) hours as needed.     omeprazole (PRILOSEC) 40 MG capsule Take 40 mg by mouth daily.     rosuvastatin (CRESTOR) 20 MG tablet Take 20 mg by mouth daily.     No current facility-administered medications on file prior  to visit.    LABS/IMAGING: No results found for this or any previous visit (from the past 48 hour(s)). No results found.  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  He remains asymptomatic and exercises regularly.  He did have repeat labs which are pending.  I will contact him when we have those results but hopefully he will of reached target on his rosuvastatin.  Plan follow-up with me annually or sooner as necessary.  [name], MD, [facility], [place] Director of the Advanced Lipid Disorders &  Cardiovascular Risk Reduction Clinic Diplomate of the American Board of Clinical Lipidology Attending Cardiologist  Direct Dial: [phone]  Fax: [phone]  Website:  [email or web address] 11/10/2022, 8:43 AM

## 2022-11-10 NOTE — Patient Instructions (Signed)
Medication Instructions:  Your physician recommends that you continue on your current medications as directed. Please refer to the Current Medication list given to you today.  *If you need a refill on your cardiac medications before your next appointment, please call your pharmacy*   Follow-Up: At [facility], you and your health needs are our priority.  As part of our continuing mission to provide you with exceptional heart care, we have created designated Provider Care Teams.  These Care Teams include your primary Cardiologist (physician) and Advanced Practice Providers (APPs -  Physician Assistants and Nurse Practitioners) who all work together to provide you with the care you need, when you need it.  We recommend signing up for the patient portal called "MyChart".  Sign up information is provided on this After Visit Summary.  MyChart is used to connect with patients for Virtual Visits (Telemedicine).  Patients are able to view lab/test results, encounter notes, upcoming appointments, etc.  Non-urgent messages can be sent to your provider as well.   To learn more about what you can do with MyChart, go to [email or web address].    Your next appointment:    12 months with Dr. Hilty 

## 2023-07-07 ENCOUNTER — Other Ambulatory Visit: Payer: Self-pay | Admitting: Gastroenterology

## 2023-07-07 DIAGNOSIS — R09A2 Foreign body sensation, throat: Secondary | ICD-10-CM

## 2023-07-14 ENCOUNTER — Ambulatory Visit
Admission: RE | Admit: 2023-07-14 | Discharge: 2023-07-14 | Disposition: A | Discharge status: Home / Self Care | Payer: PPO | Source: Ambulatory Visit | Attending: Gastroenterology | Admitting: Gastroenterology

## 2023-07-14 DIAGNOSIS — R09A2 Foreign body sensation, throat: Secondary | ICD-10-CM

## 2023-09-22 ENCOUNTER — Other Ambulatory Visit: Payer: Self-pay | Admitting: *Deleted

## 2023-09-22 DIAGNOSIS — E785 Hyperlipidemia, unspecified: Secondary | ICD-10-CM

## 2023-11-04 LAB — NMR, LIPOPROFILE
Cholesterol, Total: 124 mg/dL (ref 100–199)
HDL Particle Number: 38.6 umol/L (ref >=30.5)
HDL-C: 76 mg/dL (ref >39)
LDL Particle Number: <300 nmol/L (ref <1000)
LDL-C (NIH Calc): 39 mg/dL (ref 0–99)
LP-IR Score: <25 (ref <=45)
Small LDL Particle Number: <90 nmol/L (ref <=527)
Triglycerides: 35 mg/dL (ref 0–149)

## 2023-11-08 ENCOUNTER — Encounter: Payer: Self-pay | Admitting: Internal Medicine

## 2023-11-08 ENCOUNTER — Ambulatory Visit: Payer: PPO | Attending: Internal Medicine | Admitting: Internal Medicine

## 2023-11-08 VITALS — BP 116/72 | HR 74 | Ht 70.0 in | Wt 153.2 lb

## 2023-11-08 DIAGNOSIS — R931 Abnormal findings on diagnostic imaging of heart and coronary circulation: Secondary | ICD-10-CM

## 2023-11-08 DIAGNOSIS — E785 Hyperlipidemia, unspecified: Secondary | ICD-10-CM | POA: Diagnosis not present

## 2023-11-08 NOTE — Patient Instructions (Signed)
 Medication Instructions:  NO CHANGES  *If you need a refill on your cardiac medications before your next appointment, please call your pharmacy*   Lab Work: Non-Fasting High Sensitivity CRP  Fasting NMR lipoprofile in 1 year -- before next visit  If you have labs (blood work) drawn today and your tests are completely normal, you will receive your results only by: MyChart Message (if you have MyChart) OR A paper copy in the mail If you have any lab test that is abnormal or we need to change your treatment, we will call you to review the results.   Follow-Up: At [facility], you and your health needs are our priority.  As part of our continuing mission to provide you with exceptional heart care, we have created designated Provider Care Teams.  These Care Teams include your primary Cardiologist (physician) and Advanced Practice Providers (APPs -  Physician Assistants and Nurse Practitioners) who all work together to provide you with the care you need, when you need it.  We recommend signing up for the patient portal called MyChart.  Sign up information is provided on this After Visit Summary.  MyChart is used to connect with patients for Virtual Visits (Telemedicine).  Patients are able to view lab/test results, encounter notes, upcoming appointments, etc.  Non-urgent messages can be sent to your provider as well.   To learn more about what you can do with MyChart, go to [email or web address].    Your next appointment:    12 months with Dr. Mona

## 2023-11-08 NOTE — Progress Notes (Signed)
 LIPID CLINIC CONSULT NOTE  Chief Complaint:  Follow-up dyslipidemia  Primary Care Physician: Patient, No Pcp Per  Primary Cardiologist:  None  HPI:  Jorge Finley is a 68 y.o. male who is being seen today for the evaluation of dyslipidemia at the request of No ref. provider found. this is a pleasant 68 year old male with little past medical history.  He reports a fairly active and healthy lifestyle.  He says he is try to reduce saturated fats in his diet more recently.  He has exercised and competitively training for a number of years.  He does note that his father had a stroke in his 9s and then another stroke late in his 73s.  More recently he was having issues with breathing during exercise, particularly running.  It was thought that he might be having reflux symptoms.  He was placed on medications for this and had a CT scan of the lungs which showed coronary artery calcification.  Subsequently was sent for dedicated CT scan that showed a very high calcium score of 694, 86 percentile for age and sex matched controls.  He had coronary calcium of all 3 coronaries including the left main.  He then had a lipid NMR which actually showed surprisingly reasonable untreated cholesterol.  Total of 979 for LDL particle number, total cholesterol 168, triglycerides 63, HDL 65 and LDL 91.  His small LDL particle number was 308.  Subsequently was placed per guidelines on high intensity statin therapy, rosuvastatin 20 mg daily which she is taken for about 2-1/2 weeks.  He is also taking low-dose aspirin.  He is asymptomatic with exercise other than he does get this scratchiness/mild tightness in his throat with exertion but also without exertion.  Its not clear that this is angina is not limiting to his exercise.  11/10/2022  Jorge Finley returns today for follow-up.  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 He had testing for LP(a) which was negative at 33.7 nmol/L.  He reports no issues with rosuvastatin 20 mg daily.  He had labs drawn to this past Monday but they have not yet resulted.  11/08/2023  Jorge Finley is seen today in follow-up.  He continues to do well.  He is tolerating the rosuvastatin without side effects.  He is further curbed his diet and has noted more reduction in his cholesterol.  LDL particle number now less than 300 with an LDL of 39 (down from 55), HDL 76 and triglycerides 35.  His small LDL particles now less than 90.  Overall very low risk profile.  We discussed the possibility of looking for elevated hs-CRP which now has some therapeutic option.  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  He continues to exercise and is asymptomatic.  PMHx:  Past Medical History:  Diagnosis Date   Basal cell carcinoma [id number]   left posterior leg    Past Surgical History:  Procedure Laterality Date   FRACTURE SURGERY Left    leg -childhood   HERNIA REPAIR Bilateral    8 years ago    FAMHx:  Family History  Problem Relation Age of Onset   Healthy Mother    Cancer Father        prostate   Cancer Sister    Healthy Brother    Healthy  Brother    Asthma Daughter     SOCHx:   reports that he has never smoked. He has never used smokeless tobacco. He reports current alcohol use of about 14.0 standard drinks of alcohol per week. He reports that he does not use drugs.  ALLERGIES:  No Known Allergies  ROS: Pertinent items noted in HPI and remainder of comprehensive ROS otherwise negative.  HOME MEDS: Current Outpatient Medications on File Prior to Visit  Medication Sig Dispense Refill   albuterol (VENTOLIN HFA) 108 (90 Base) MCG/ACT inhaler SMARTSIG:1.5 Inhalation Via Inhaler Every 4-6 Hours PRN     ASPIRIN 81 PO Take 81 mg by mouth daily.     fexofenadine (ALLEGRA) 180 MG tablet Take 180 mg by  mouth daily.     ipratropium (ATROVENT) 0.03 % nasal spray Place 21 mcg into the nose as needed.     minoxidil (LONITEN) 2.5 MG tablet Take 2.5 mg by mouth 2 (two) times daily.     rosuvastatin (CRESTOR) 20 MG tablet Take 20 mg by mouth daily.     albuterol (VENTOLIN HFA) 108 (90 Base) MCG/ACT inhaler Inhale 2 puffs into the lungs as needed.     budesonide -formoterol  (SYMBICORT ) 80-4.5 MCG/ACT inhaler Inhale 2 puffs into the lungs 2 (two) times daily. (Patient not taking: Reported on 11/08/2023) 1 each 6   famotidine (PEPCID) 20 MG tablet Take 20 mg by mouth daily. (Patient not taking: Reported on 11/08/2023)     finasteride (PROSCAR) 5 MG tablet Take 5 mg by mouth daily.     guaifenesin (HUMIBID E) 400 MG TABS tablet Take 400 mg by mouth every 6 (six) hours as needed. (Patient not taking: Reported on 11/08/2023)     omeprazole (PRILOSEC) 40 MG capsule Take 40 mg by mouth daily. (Patient not taking: Reported on 11/08/2023)     pantoprazole (PROTONIX) 40 MG tablet Take 40 mg by mouth in the morning.     No current facility-administered medications on file prior to visit.    LABS/IMAGING: No results found for this or any previous visit (from the past 48 hours). No results found.  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   Jorge Finley continues to do well.  His cholesterol is well treated.  His LP(a) was negative.  Genetic testing did show APO B variants.  Calcium score was quite high for his age.  He does have risk factors including early onset heart disease in the family.  I would like to check a chest-CRP to see if that is a potential additional treatment target.  Otherwise, I feel that he is well treated.  Will plan follow-up annually or sooner as necessary.  Vinie KYM Maxcy, MD, [facility],  [facility]  [facility]  Medical Director of the Advanced Lipid Disorders &  Cardiovascular Risk Reduction Clinic Diplomate of the American Board of Clinical Lipidology Attending Cardiologist  Direct Dial: [phone]  Fax: [phone]  Website:  [email or web address] Vinie BROCKS Faye Sanfilippo 11/08/2023, 9:07 AM

## 2023-11-09 ENCOUNTER — Encounter: Payer: Self-pay | Admitting: Internal Medicine

## 2023-11-09 LAB — HIGH SENSITIVITY CRP: CRP, High Sensitivity: 0.26 mg/L (ref 0.00–3.00)

## 2024-06-26 ENCOUNTER — Other Ambulatory Visit: Payer: Self-pay | Admitting: Internal Medicine

## 2024-06-26 DIAGNOSIS — R7989 Other specified abnormal findings of blood chemistry: Secondary | ICD-10-CM

## 2024-07-06 ENCOUNTER — Ambulatory Visit
Admission: RE | Admit: 2024-07-06 | Discharge: 2024-07-06 | Disposition: A | Discharge status: Home / Self Care | Source: Ambulatory Visit | Attending: Internal Medicine | Admitting: Internal Medicine

## 2024-07-06 DIAGNOSIS — R7989 Other specified abnormal findings of blood chemistry: Secondary | ICD-10-CM

## 2024-07-06 MED ORDER — GADOPICLENOL 0.5 MMOL/ML IV SOLN
7.0000 mL | Freq: Once | INTRAVENOUS | Status: AC | PRN
  Administered 2024-07-06: 7 mL via INTRAVENOUS

## 2024-10-22 ENCOUNTER — Other Ambulatory Visit: Payer: Self-pay | Admitting: *Deleted

## 2024-10-22 DIAGNOSIS — E785 Hyperlipidemia, unspecified: Secondary | ICD-10-CM

## 2024-10-22 DIAGNOSIS — R931 Abnormal findings on diagnostic imaging of heart and coronary circulation: Secondary | ICD-10-CM

## 2024-11-07 ENCOUNTER — Encounter: Payer: Self-pay | Admitting: Internal Medicine

## 2024-11-21 ENCOUNTER — Ambulatory Visit: Payer: Self-pay | Admitting: Internal Medicine

## 2024-11-21 LAB — NMR, LIPOPROFILE
Cholesterol, Total: 189 mg/dL (ref 100–199)
HDL Particle Number: 38.1 umol/L
HDL-C: 77 mg/dL
LDL Particle Number: 707 nmol/L
LDL Size: 21.1 nm
LDL-C (NIH Calc): 94 mg/dL (ref 0–99)
LP-IR Score: <25
Small LDL Particle Number: <90 nmol/L
Triglycerides: 103 mg/dL (ref 0–149)

## 2024-12-21 ENCOUNTER — Ambulatory Visit: Admitting: Internal Medicine
